# Patient Record
Sex: Female | Born: 1940 | Race: White | Hispanic: No | Marital: Married | Smoking: Never smoker
Health system: Southern US, Community
[De-identification: ages and names within clinical notes are randomized; demographics above are authoritative.]

## PROBLEM LIST (undated history)

## (undated) DIAGNOSIS — I6529 Occlusion and stenosis of unspecified carotid artery: Secondary | ICD-10-CM

## (undated) DIAGNOSIS — H269 Unspecified cataract: Secondary | ICD-10-CM

## (undated) HISTORY — PX: CATARACT EXTRACTION: SUR2

## (undated) HISTORY — DX: Occlusion and stenosis of unspecified carotid artery: I65.29

## (undated) HISTORY — DX: Unspecified cataract: H26.9

## (undated) HISTORY — PX: EYE SURGERY: SHX253

---

## 2013-09-09 ENCOUNTER — Other Ambulatory Visit: Payer: Self-pay | Admitting: Family Medicine

## 2013-09-09 DIAGNOSIS — Z1231 Encounter for screening mammogram for malignant neoplasm of breast: Secondary | ICD-10-CM

## 2019-10-19 ENCOUNTER — Ambulatory Visit (INDEPENDENT_AMBULATORY_CARE_PROVIDER_SITE_OTHER): Payer: Medicare PPO | Admitting: Family Medicine

## 2019-10-19 ENCOUNTER — Encounter: Payer: Self-pay | Admitting: Family Medicine

## 2019-10-19 ENCOUNTER — Other Ambulatory Visit: Payer: Self-pay

## 2019-10-19 VITALS — BP 112/62 | HR 68 | Temp 97.4°F | Resp 17 | Ht 63.0 in | Wt 97.6 lb

## 2019-10-19 DIAGNOSIS — K648 Other hemorrhoids: Secondary | ICD-10-CM

## 2019-10-19 DIAGNOSIS — M810 Age-related osteoporosis without current pathological fracture: Secondary | ICD-10-CM | POA: Diagnosis not present

## 2019-10-19 DIAGNOSIS — K219 Gastro-esophageal reflux disease without esophagitis: Secondary | ICD-10-CM | POA: Diagnosis not present

## 2019-10-19 DIAGNOSIS — I10 Essential (primary) hypertension: Secondary | ICD-10-CM

## 2019-10-19 DIAGNOSIS — K5790 Diverticulosis of intestine, part unspecified, without perforation or abscess without bleeding: Secondary | ICD-10-CM | POA: Insufficient documentation

## 2019-10-19 HISTORY — DX: Gastro-esophageal reflux disease without esophagitis: K21.9

## 2019-10-19 HISTORY — DX: Diverticulosis of intestine, part unspecified, without perforation or abscess without bleeding: K57.90

## 2019-10-19 HISTORY — DX: Essential (primary) hypertension: I10

## 2019-10-19 HISTORY — DX: Age-related osteoporosis without current pathological fracture: M81.0

## 2019-10-19 HISTORY — DX: Other hemorrhoids: K64.8

## 2019-10-19 MED ORDER — BISOPROLOL-HYDROCHLOROTHIAZIDE 2.5-6.25 MG PO TABS: 0.5000 | ORAL_TABLET | Freq: Every day | ORAL | 1 refills | Status: DC

## 2019-10-19 NOTE — Progress Notes (Signed)
New Patient Office Visit  Subjective:  Patient ID: Dawn Harris, female    DOB: 12/27/1940  Age: 79 y.o. MRN: [id number]  CC: HTN-needs medication refilled  HPI Dawn Harris presents for HTN-takes ziac 1/2 daily.  Pt has taken medication for over 12 years.  No heart issues.  No recent labwork-2018.  No h/o cholesterol medication.  Red Yeast Rice in the past.  Constipation-MOM-1996.  Colonoscopy-diverticulitis-pt did not complete follow up colonoscopy.  No blood in the bowels.  No pain with hemorrhoids and swell. Pt states stress worsens symptoms.    Osteoporosis-medications in the past-3-4 years-pt states Dr. Clarene Duke took her off medication-Calcium600 +Vit D + MV, Vit B-no h/o of fracture  Past Medical History:  Diagnosis Date  . Diverticulosis 10/19/2019  . Essential hypertension 10/19/2019  . GERD (gastroesophageal reflux disease) 10/19/2019  . Internal hemorrhoid 10/19/2019  No family history on file. Lives with her husband- Social History   Socioeconomic History  . Marital status: Married    Spouse name: Not on file  . Number of children: 2  . Years of education: Not on file  . Highest education level: Not on file  Occupational History  . Occupation: Retired  Tobacco Use  . Smoking status: Never Smoker  . Smokeless tobacco: Never Used  Vaping Use  . Vaping Use: Never used  Substance and Sexual Activity  . Alcohol use: Never  . Drug use: Never  . Sexual activity: Not Currently    Partners: Male  Other Topics Concern  . Not on file  Social History Narrative  . Not on file   Social Determinants of Health   Financial Resource Strain:   . Difficulty of Paying Living Expenses: Not on file  Food Insecurity:   . Worried About Programme researcher, broadcasting/film/video in the Last Year: Not on file  . Ran Out of Food in the Last Year: Not on file  Transportation Needs:   . Lack of Transportation (Medical): Not on file  . Lack of Transportation (Non-Medical): Not on file  Physical  Activity:   . Days of Exercise per Week: Not on file  . Minutes of Exercise per Session: Not on file  Stress:   . Feeling of Stress : Not on file  Social Connections:   . Frequency of Communication with Friends and Family: Not on file  . Frequency of Social Gatherings with Friends and Family: Not on file  . Attends Religious Services: Not on file  . Active Member of Clubs or Organizations: Not on file  . Attends Banker Meetings: Not on file  . Marital Status: Not on file  Intimate Partner Violence:   . Fear of Current or Ex-Partner: Not on file  . Emotionally Abused: Not on file  . Physically Abused: Not on file  . Sexually Abused: Not on file    ROS Review of Systems  Eyes:       Appt with eye exam in July 21  Endocrine: Negative.   Genitourinary: Negative.   Musculoskeletal: Negative.        Osteoporosis  Allergic/Immunologic: Negative.   Neurological: Negative for dizziness and headaches.  Hematological: Negative.     Objective:   Today's Vitals: BP 112/62   Pulse 68   Temp (!) 97.4 F (36.3 C)   Resp 17   Ht 5\' 3"  (1.6 m)   Wt 97 lb 9.6 oz (44.3 kg)   SpO2 99%   BMI 17.29 kg/m   Physical Exam Constitutional:  Appearance: Normal appearance.  HENT:     Head: Normocephalic and atraumatic.  Cardiovascular:     Rate and Rhythm: Normal rate and regular rhythm.     Pulses: Normal pulses.     Heart sounds: Normal heart sounds.  Pulmonary:     Effort: Pulmonary effort is normal.     Breath sounds: Normal breath sounds.  Musculoskeletal:        General: Normal range of motion.     Cervical back: Neck supple.  Neurological:     Mental Status: She is alert.  Psychiatric:        Mood and Affect: Mood normal.        Behavior: Behavior normal.     Assessment & Plan:   Problem List Items Addressed This Visit      Cardiovascular and Mediastinum   Essential hypertension   Relevant Medications   bisoprolol-hydrochlorothiazide (ZIAC)  2.5-6.25 MG tablet   aspirin EC 81 MG tablet     Digestive   GERD (gastroesophageal reflux disease)      Outpatient Encounter Medications as of 10/19/2019  Medication Sig  . aspirin EC 81 MG tablet Take 81 mg by mouth daily. Swallow whole.  . bisoprolol-hydrochlorothiazide (ZIAC) 2.5-6.25 MG tablet Take 0.5 tablets by mouth daily.  . calcium-vitamin D (OSCAL WITH D) 500-200 MG-UNIT tablet Take 1 tablet by mouth.  . Multiple Vitamins-Minerals (MULTIVITAMIN WITH MINERALS) tablet Take 1 tablet by mouth daily.  . vitamin B-12 (CYANOCOBALAMIN) 500 MCG tablet Take 500 mcg by mouth daily.   No facility-administered encounter medications on file as of 10/19/2019.    Follow-up: fasting labwork  Tryniti Laatsch Mat Carne, MD

## 2019-10-19 NOTE — Patient Instructions (Signed)
Osteoporosis  Osteoporosis is thinning and loss of density in your bones. Osteoporosis makes bones more brittle and fragile and more likely to break (fracture). Over time, osteoporosis can cause your bones to become so weak that they fracture after a minor fall. Bones in the hip, wrist, and spine are most likely to fracture due to osteoporosis. What are the causes? The exact cause of this condition is not known. What increases the risk? You may be at greater risk for osteoporosis if you:  Have a family history of the condition.  Have poor nutrition.  Use steroid medicines, such as prednisone.  Are female.  Are age 54 or older.  Smoke or have a history of smoking.  Are not physically active (are sedentary).  Are white (Caucasian) or of Asian descent.  Have a small body frame.  Take certain medicines, such as antiseizure medicines. What are the signs or symptoms? A fracture might be the first sign of osteoporosis, especially if the fracture results from a fall or injury that usually would not cause a bone to break. Other signs and symptoms include:  Pain in the neck or low back.  Stooped posture.  Loss of height. How is this diagnosed? This condition may be diagnosed based on:  Your medical history.  A physical exam.  A bone mineral density test, also called a DXA or DEXA test (dual-energy X-ray absorptiometry test). This test uses X-rays to measure the amount of minerals in your bones. How is this treated? The goal of treatment is to strengthen your bones and lower your risk for a fracture. Treatment may involve:  Making lifestyle changes, such as: ? Including foods with more calcium and vitamin D in your diet. ? Doing weight-bearing and muscle-strengthening exercises. ? Stopping tobacco use. ? Limiting alcohol intake.  Taking medicine to slow the process of bone loss or to increase bone density.  Taking daily supplements of calcium and vitamin D.  Taking  hormone replacement medicines, such as estrogen for women and testosterone for men.  Monitoring your levels of calcium and vitamin D. Follow these instructions at home:  Activity  Exercise as told by your health care provider. Ask your health care provider what exercises and activities are safe for you. You should do: ? Exercises that make you work against gravity (weight-bearing exercises), such as tai chi, yoga, or walking. ? Exercises to strengthen muscles, such as lifting weights. Lifestyle  Limit alcohol intake to no more than 1 drink a day for nonpregnant women and 2 drinks a day for men. One drink equals 12 oz of beer, 5 oz of wine, or 1 oz of hard liquor.  Do not use any products that contain nicotine or tobacco, such as cigarettes and e-cigarettes. If you need help quitting, ask your health care provider. Preventing falls  Use devices to help you move around (mobility aids) as needed, such as canes, walkers, scooters, or crutches.  Keep rooms well-lit and clutter-free.  Remove tripping hazards from walkways, including cords and throw rugs.  Install grab bars in bathrooms and safety rails on stairs.  Use rubber mats in the bathroom and other areas that are often wet or slippery.  Wear closed-toe shoes that fit well and support your feet. Wear shoes that have rubber soles or low heels.  Review your medicines with your health care provider. Some medicines can cause dizziness or changes in blood pressure, which can increase your risk of falling. General instructions  Include calcium and vitamin D in  your diet. Calcium is important for bone health, and vitamin D helps your body to absorb calcium. Good sources of calcium and vitamin D include: ? Certain fatty fish, such as salmon and tuna. ? Products that have calcium and vitamin D added to them (fortified products), such as fortified cereals. ? Egg yolks. ? Cheese. ? Liver.  Take over-the-counter and prescription medicines  only as told by your health care provider.  Keep all follow-up visits as told by your health care provider. This is important. Contact a health care provider if:  You have never been screened for osteoporosis and you are: ? A woman who is age 57 or older. ? A man who is age 38 or older. Get help right away if:  You fall or injure yourself. Summary  Osteoporosis is thinning and loss of density in your bones. This makes bones more brittle and fragile and more likely to break (fracture),even with minor falls.  The goal of treatment is to strengthen your bones and reduce your risk for a fracture.  Include calcium and vitamin D in your diet. Calcium is important for bone health, and vitamin D helps your body to absorb calcium.  Talk with your health care provider about screening for osteoporosis if you are a woman who is age 9 or older, or a man who is age 49 or older. This information is not intended to replace advice given to you by your health care provider. Make sure you discuss any questions you have with your health care provider. Document Revised: 01/18/2017 Document Reviewed: 11/30/2016 Elsevier Patient Education  2020 [employer]. Managing Your Hypertension Hypertension is commonly called high blood pressure. This is when the force of your blood pressing against the walls of your arteries is too strong. Arteries are blood vessels that carry blood from your heart throughout your body. Hypertension forces the heart to work harder to pump blood, and may cause the arteries to become narrow or stiff. Having untreated or uncontrolled hypertension can cause heart attack, stroke, kidney disease, and other problems. What are blood pressure readings? A blood pressure reading consists of a higher number over a lower number. Ideally, your blood pressure should be below 120/80. The first ("top") number is called the systolic pressure. It is a measure of the pressure in your arteries as your heart  beats. The second ("bottom") number is called the diastolic pressure. It is a measure of the pressure in your arteries as the heart relaxes. What does my blood pressure reading mean? Blood pressure is classified into four stages. Based on your blood pressure reading, your health care provider may use the following stages to determine what type of treatment you need, if any. Systolic pressure and diastolic pressure are measured in a unit called mm Hg. Normal  Systolic pressure: below 696.  Diastolic pressure: below 80. Elevated  Systolic pressure: 789-381.  Diastolic pressure: below 80. Hypertension stage 1  Systolic pressure: 017-510.  Diastolic pressure: 25-85. Hypertension stage 2  Systolic pressure: 277 or above.  Diastolic pressure: 90 or above. What health risks are associated with hypertension? Managing your hypertension is an important responsibility. Uncontrolled hypertension can lead to:  A heart attack.  A stroke.  A weakened blood vessel (aneurysm).  Heart failure.  Kidney damage.  Eye damage.  Metabolic syndrome.  Memory and concentration problems. What changes can I make to manage my hypertension? Hypertension can be managed by making lifestyle changes and possibly by taking medicines. Your health care provider will  help you make a plan to bring your blood pressure within a normal range. Eating and drinking   Eat a diet that is high in fiber and potassium, and low in salt (sodium), added sugar, and fat. An example eating plan is called the DASH (Dietary Approaches to Stop Hypertension) diet. To eat this way: ? Eat plenty of fresh fruits and vegetables. Try to fill half of your plate at each meal with fruits and vegetables. ? Eat whole grains, such as whole wheat pasta, brown rice, or whole grain bread. Fill about one quarter of your plate with whole grains. ? Eat low-fat diary products. ? Avoid fatty cuts of meat, processed or cured meats, and poultry  with skin. Fill about one quarter of your plate with lean proteins such as fish, chicken without skin, beans, eggs, and tofu. ? Avoid premade and processed foods. These tend to be higher in sodium, added sugar, and fat.  Reduce your daily sodium intake. Most people with hypertension should eat less than 1,500 mg of sodium a day.  Limit alcohol intake to no more than 1 drink a day for nonpregnant women and 2 drinks a day for men. One drink equals 12 oz of beer, 5 oz of wine, or 1 oz of hard liquor. Lifestyle  Work with your health care provider to maintain a healthy body weight, or to lose weight. Ask what an ideal weight is for you.  Get at least 30 minutes of exercise that causes your heart to beat faster (aerobic exercise) most days of the week. Activities may include walking, swimming, or biking.  Include exercise to strengthen your muscles (resistance exercise), such as weight lifting, as part of your weekly exercise routine. Try to do these types of exercises for 30 minutes at least 3 days a week.  Do not use any products that contain nicotine or tobacco, such as cigarettes and e-cigarettes. If you need help quitting, ask your health care provider.  Control any long-term (chronic) conditions you have, such as high cholesterol or diabetes. Monitoring  Monitor your blood pressure at home as told by your health care provider. Your personal target blood pressure may vary depending on your medical conditions, your age, and other factors.  Have your blood pressure checked regularly, as often as told by your health care provider. Working with your health care provider  Review all the medicines you take with your health care provider because there may be side effects or interactions.  Talk with your health care provider about your diet, exercise habits, and other lifestyle factors that may be contributing to hypertension.  Visit your health care provider regularly. Your health care provider  can help you create and adjust your plan for managing hypertension. Will I need medicine to control my blood pressure? Your health care provider may prescribe medicine if lifestyle changes are not enough to get your blood pressure under control, and if:  Your systolic blood pressure is 130 or higher.  Your diastolic blood pressure is 80 or higher. Take medicines only as told by your health care provider. Follow the directions carefully. Blood pressure medicines must be taken as prescribed. The medicine does not work as well when you skip doses. Skipping doses also puts you at risk for problems. Contact a health care provider if:  You think you are having a reaction to medicines you have taken.  You have repeated (recurrent) headaches.  You feel dizzy.  You have swelling in your ankles.  You have trouble  with your vision. Get help right away if:  You develop a severe headache or confusion.  You have unusual weakness or numbness, or you feel faint.  You have severe pain in your chest or abdomen.  You vomit repeatedly.  You have trouble breathing. Summary  Hypertension is when the force of blood pumping through your arteries is too strong. If this condition is not controlled, it may put you at risk for serious complications.  Your personal target blood pressure may vary depending on your medical conditions, your age, and other factors. For most people, a normal blood pressure is less than 120/80.  Hypertension is managed by lifestyle changes, medicines, or both. Lifestyle changes include weight loss, eating a healthy, low-sodium diet, exercising more, and limiting alcohol. This information is not intended to replace advice given to you by your health care provider. Make sure you discuss any questions you have with your health care provider. Document Revised: 05/30/2018 Document Reviewed: 01/04/2016 Elsevier Patient Education  [place].

## 2019-10-20 ENCOUNTER — Other Ambulatory Visit: Payer: Medicare PPO

## 2019-10-21 ENCOUNTER — Other Ambulatory Visit: Payer: Self-pay | Admitting: Family Medicine

## 2019-10-21 ENCOUNTER — Encounter: Payer: Self-pay | Admitting: Family Medicine

## 2019-10-21 DIAGNOSIS — N289 Disorder of kidney and ureter, unspecified: Secondary | ICD-10-CM

## 2019-10-21 LAB — COMPREHENSIVE METABOLIC PANEL
ALT: 11 IU/L (ref 0–32)
AST: 17 IU/L (ref 0–40)
Albumin/Globulin Ratio: 1.6 (ref 1.2–2.2)
Albumin: 4.6 g/dL (ref 3.7–4.7)
Alkaline Phosphatase: 70 IU/L (ref 48–121)
BUN/Creatinine Ratio: 19 (ref 12–28)
BUN: 19 mg/dL (ref 8–27)
Bilirubin Total: 0.5 mg/dL (ref 0.0–1.2)
CO2: 25 mmol/L (ref 20–29)
Calcium: 9.6 mg/dL (ref 8.7–10.3)
Chloride: 104 mmol/L (ref 96–106)
Creatinine, Ser: 1.01 mg/dL — ABNORMAL HIGH (ref 0.57–1.00)
GFR calc Af Amer: 61 mL/min/{1.73_m2} (ref >59)
GFR calc non Af Amer: 53 mL/min/{1.73_m2} — ABNORMAL LOW (ref >59)
Globulin, Total: 2.8 g/dL (ref 1.5–4.5)
Glucose: 89 mg/dL (ref 65–99)
Potassium: 4.3 mmol/L (ref 3.5–5.2)
Sodium: 141 mmol/L (ref 134–144)
Total Protein: 7.4 g/dL (ref 6.0–8.5)

## 2019-10-21 LAB — CBC WITH DIFFERENTIAL/PLATELET
Basophils Absolute: 0.1 10*3/uL (ref 0.0–0.2)
Basos: 1 %
EOS (ABSOLUTE): 0.1 10*3/uL (ref 0.0–0.4)
Eos: 1 %
Hematocrit: 39.5 % (ref 34.0–46.6)
Hemoglobin: 13.5 g/dL (ref 11.1–15.9)
Immature Grans (Abs): 0 10*3/uL (ref 0.0–0.1)
Immature Granulocytes: 0 %
Lymphocytes Absolute: 2.5 10*3/uL (ref 0.7–3.1)
Lymphs: 28 %
MCH: 31.2 pg (ref 26.6–33.0)
MCHC: 34.2 g/dL (ref 31.5–35.7)
MCV: 91 fL (ref 79–97)
Monocytes Absolute: 0.6 10*3/uL (ref 0.1–0.9)
Monocytes: 7 %
Neutrophils Absolute: 5.7 10*3/uL (ref 1.4–7.0)
Neutrophils: 63 %
Platelets: 272 10*3/uL (ref 150–450)
RBC: 4.33 x10E6/uL (ref 3.77–5.28)
RDW: 12.4 % (ref 11.7–15.4)
WBC: 9 10*3/uL (ref 3.4–10.8)

## 2019-10-21 LAB — LIPID PANEL W/O CHOL/HDL RATIO
Cholesterol, Total: 226 mg/dL — ABNORMAL HIGH (ref 100–199)
HDL: 60 mg/dL (ref >39)
LDL Chol Calc (NIH): 139 mg/dL — ABNORMAL HIGH (ref 0–99)
Triglycerides: 153 mg/dL — ABNORMAL HIGH (ref 0–149)
VLDL Cholesterol Cal: 27 mg/dL (ref 5–40)

## 2019-10-21 LAB — VITAMIN D 25 HYDROXY (VIT D DEFICIENCY, FRACTURES): Vit D, 25-Hydroxy: 50 ng/mL (ref 30.0–100.0)

## 2019-10-21 LAB — CARDIOVASCULAR RISK ASSESSMENT

## 2019-10-21 LAB — TSH: TSH: 3.68 u[IU]/mL (ref 0.450–4.500)

## 2019-12-01 ENCOUNTER — Ambulatory Visit (INDEPENDENT_AMBULATORY_CARE_PROVIDER_SITE_OTHER): Payer: Medicare Other

## 2019-12-01 DIAGNOSIS — Z23 Encounter for immunization: Secondary | ICD-10-CM

## 2019-12-01 NOTE — Progress Notes (Signed)
   Covid-19 Vaccination Clinic  Name:  Dawn Harris    MRN: [id number] DOB: [birth year]  12/01/2019  Dawn Harris was observed post Covid-19 immunization for 15 minutes without incident. She was provided with Vaccine Information Sheet and instruction to access the V-Safe system.   Dawn Harris was instructed to call 911 with any severe reactions post vaccine: Marland Kitchen Difficulty breathing  . Swelling of face and throat  . A fast heartbeat  . A bad rash all over body  . Dizziness and weakness

## 2020-03-31 DIAGNOSIS — D485 Neoplasm of uncertain behavior of skin: Secondary | ICD-10-CM | POA: Diagnosis not present

## 2020-03-31 DIAGNOSIS — D0439 Carcinoma in situ of skin of other parts of face: Secondary | ICD-10-CM | POA: Diagnosis not present

## 2020-03-31 DIAGNOSIS — L570 Actinic keratosis: Secondary | ICD-10-CM | POA: Diagnosis not present

## 2020-03-31 DIAGNOSIS — L821 Other seborrheic keratosis: Secondary | ICD-10-CM | POA: Diagnosis not present

## 2020-03-31 DIAGNOSIS — L578 Other skin changes due to chronic exposure to nonionizing radiation: Secondary | ICD-10-CM | POA: Diagnosis not present

## 2020-03-31 DIAGNOSIS — L728 Other follicular cysts of the skin and subcutaneous tissue: Secondary | ICD-10-CM | POA: Diagnosis not present

## 2020-04-21 ENCOUNTER — Ambulatory Visit: Payer: Medicare PPO | Admitting: Family Medicine

## 2020-07-28 ENCOUNTER — Ambulatory Visit: Payer: Medicare PPO | Admitting: Family Medicine

## 2020-09-21 ENCOUNTER — Encounter: Payer: Self-pay | Admitting: Legal Medicine

## 2020-09-21 ENCOUNTER — Ambulatory Visit: Payer: Medicare PPO | Admitting: Legal Medicine

## 2020-09-21 ENCOUNTER — Other Ambulatory Visit: Payer: Self-pay

## 2020-09-21 VITALS — BP 124/60 | HR 63 | Temp 97.7°F | Resp 15 | Ht 63.0 in | Wt 97.0 lb

## 2020-09-21 DIAGNOSIS — N1831 Chronic kidney disease, stage 3a: Secondary | ICD-10-CM | POA: Diagnosis not present

## 2020-09-21 DIAGNOSIS — I10 Essential (primary) hypertension: Secondary | ICD-10-CM | POA: Diagnosis not present

## 2020-09-21 DIAGNOSIS — R42 Dizziness and giddiness: Secondary | ICD-10-CM | POA: Insufficient documentation

## 2020-09-21 DIAGNOSIS — N179 Acute kidney failure, unspecified: Secondary | ICD-10-CM | POA: Diagnosis not present

## 2020-09-21 HISTORY — DX: Dizziness and giddiness: R42

## 2020-09-21 LAB — POCT URINALYSIS DIP (CLINITEK)
Bilirubin, UA: NEGATIVE
Blood, UA: NEGATIVE
Glucose, UA: NEGATIVE mg/dL
Ketones, POC UA: NEGATIVE mg/dL
Leukocytes, UA: NEGATIVE
Nitrite, UA: NEGATIVE
POC PROTEIN,UA: NEGATIVE
Spec Grav, UA: 1.01 (ref 1.010–1.025)
Urobilinogen, UA: 0.2 E.U./dL
pH, UA: 6.5 (ref 5.0–8.0)

## 2020-09-21 NOTE — Progress Notes (Signed)
Established Patient Office Visit  Subjective:  Patient ID: Dawn Harris, female    DOB: 07-21-1940  Age: 80 y.o. MRN: [id number]  CC:  Chief Complaint  Patient presents with   Dizziness   Emesis    HPI Dawn Harris presents for vertigo. She was sitting and no vertigo. Feels out of balance.  She has osteoporosis.  She is on calcium only.  Patient presents for follow up of hypertension.  Patient tolerating ziac well with side effects.  Patient was diagnosed with hypertension 2010 so has been treated for hypertension for 10 years.Patient is working on maintaining diet and exercise regimen and follows up as directed. Complication include none.   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  No family history on file.  Social History   Socioeconomic History   Marital status: Married    Spouse name: Not on file   Number of children: 2   Years of education: Not on file   Highest education level: Not on file  Occupational History   Occupation: Retired  Tobacco Use   Smoking status: Never   Smokeless tobacco: Never  Vaping Use   Vaping Use: Never used  Substance and Sexual Activity   Alcohol use: Never   Drug use: Never   Sexual activity: Not Currently    Partners: Male  Other Topics Concern   Not on file  Social History Narrative   Not on file   Social Determinants of Health   Financial Resource Strain: Not on file  Food Insecurity: Not on file  Transportation Needs: Not on file  Physical Activity: Not on file  Stress: Not on file  Social Connections: Not on file  Intimate Partner Violence: Not on file    Outpatient Medications Prior to Visit  Medication Sig Dispense Refill   aspirin EC 81 MG tablet Take 81 mg by mouth daily. Swallow whole.      bisoprolol-hydrochlorothiazide (ZIAC) 2.5-6.25 MG tablet Take 0.5 tablets by mouth daily. 90 tablet 1   calcium-vitamin D (OSCAL WITH D) 500-200 MG-UNIT tablet Take 1 tablet by mouth.     Multiple Vitamins-Minerals (MULTIVITAMIN WITH MINERALS) tablet Take 1 tablet by mouth daily.     vitamin B-12 (CYANOCOBALAMIN) 500 MCG tablet Take 500 mcg by mouth daily.     No facility-administered medications prior to visit.    Allergies  Allergen Reactions   Penicillins Hives    ROS Review of Systems  Constitutional:  Negative for activity change and appetite change.  HENT:  Negative for congestion.   Eyes:  Negative for visual disturbance.  Respiratory:  Negative for chest tightness and shortness of breath.   Cardiovascular:  Negative for chest pain and palpitations.  Gastrointestinal:  Negative for abdominal distention and abdominal pain.  Endocrine: Negative for polyuria.  Genitourinary:  Negative for difficulty urinating and dysuria.  Musculoskeletal:  Negative for arthralgias and back pain.  Neurological:  Positive for light-headedness.  Psychiatric/Behavioral: Negative.       Objective:    Physical Exam Vitals reviewed.  Constitutional:      General: She is in acute distress.     Appearance: Normal appearance.  HENT:     Head: Normocephalic.     Right Ear: Tympanic membrane, ear canal and external ear normal.  Left Ear: Tympanic membrane, ear canal and external ear normal.     Mouth/Throat:     Mouth: Mucous membranes are moist.     Pharynx: Oropharynx is clear.  Eyes:     Extraocular Movements: Extraocular movements intact.     Conjunctiva/sclera: Conjunctivae normal.     Pupils: Pupils are equal, round, and reactive to light.  Cardiovascular:     Rate and Rhythm: Normal rate and regular rhythm.     Pulses: Normal pulses.     Heart sounds: Normal heart sounds. No murmur heard.   No gallop.  Pulmonary:     Effort: Pulmonary effort is normal. No respiratory  distress.     Breath sounds: Normal breath sounds. No wheezing.  Abdominal:     General: Abdomen is flat. Bowel sounds are normal. There is no distension.     Palpations: Abdomen is soft.     Tenderness: There is no abdominal tenderness.  Musculoskeletal:        General: Normal range of motion.     Cervical back: Normal range of motion and neck supple.     Comments: Muscle wasting  Skin:    General: Skin is warm.     Capillary Refill: Capillary refill takes less than 2 seconds.  Neurological:     General: No focal deficit present.     Mental Status: She is alert and oriented to person, place, and time. Mental status is at baseline.     Comments: Negative Rhomberg    BP 124/60   Pulse 63   Temp 97.7 F (36.5 C)   Resp 15   Ht 5\' 3"  (1.6 m)   Wt 97 lb (44 kg)   LMP  (LMP Unknown)   SpO2 97%   BMI 17.18 kg/m  Wt Readings from Last 3 Encounters:  09/21/20 97 lb (44 kg)  10/19/19 97 lb 9.6 oz (44.3 kg)     Health Maintenance Due  Topic Date Due   TETANUS/TDAP  Never done   Zoster Vaccines- Shingrix (1 of 2) Never done   DEXA SCAN  Never done   PNA vac Low Risk Adult (1 of 2 - PCV13) Never done   COVID-19 Vaccine (2 - Pfizer series) 12/22/2019   INFLUENZA VACCINE  09/19/2020    There are no preventive care reminders to display for this patient.  Lab Results  Component Value Date   TSH 3.680 10/20/2019   Lab Results  Component Value Date   WBC 9.0 10/20/2019   HGB 13.5 10/20/2019   HCT 39.5 10/20/2019   MCV 91 10/20/2019   PLT 272 10/20/2019   Lab Results  Component Value Date   NA 141 10/20/2019   K 4.3 10/20/2019   CO2 25 10/20/2019   GLUCOSE 89 10/20/2019   BUN 19 10/20/2019   CREATININE 1.01 (H) 10/20/2019   BILITOT 0.5 10/20/2019   ALKPHOS 70 10/20/2019   AST 17 10/20/2019   ALT 11 10/20/2019   PROT 7.4 10/20/2019   ALBUMIN 4.6 10/20/2019   CALCIUM 9.6 10/20/2019   Lab Results  Component Value Date   CHOL 226 (H) 10/20/2019   Lab Results   Component Value Date   HDL 60 10/20/2019   Lab Results  Component Value Date   LDLCALC 139 (H) 10/20/2019   Lab Results  Component Value Date   TRIG 153 (H) 10/20/2019   No results found for: CHOLHDL No results found for: 10/22/2019    Assessment & Plan:   Problem  List Items Addressed This Visit   Diagnoses and all orders for this visit: Essential hypertension An individual hypertension care plan was established and reinforced today.  The patient's status was assessed using clinical findings on exam and labs or diagnostic tests. The patient's success at meeting treatment goals on disease specific evidence-based guidelines and found to be well controlled. SELF MANAGEMENT: The patient and I together assessed ways to personally work towards obtaining the recommended goals. RECOMMENDATIONS: avoid decongestants found in common cold remedies, decrease consumption of alcohol, perform routine monitoring of BP with home BP cuff, exercise, reduction of dietary salt, take medicines as prescribed, try not to miss doses and quit smoking.  Regular exercise and maintaining a healthy weight is needed.  Stress reduction may help. A CLINICAL SUMMARY including written plan identify barriers to care unique to individual due to social or financial issues.  We attempt to mutually creat solutions for individual and family understanding.   Acute renal failure superimposed on stage 3a chronic  kidney disease, unspecified acute renal failure type (HCC) She has been not eating or drinking well, UA OK, check lab , encourage fluids  Dizziness  Tests so far normal, get lab studies, may need further workup        Follow-up: Return in about 2 weeks (around 10/05/2020) for dizzines.    Brent Bulla, MD

## 2020-09-22 ENCOUNTER — Other Ambulatory Visit: Payer: Self-pay

## 2020-09-22 DIAGNOSIS — E871 Hypo-osmolality and hyponatremia: Secondary | ICD-10-CM

## 2020-09-22 LAB — COMPREHENSIVE METABOLIC PANEL
ALT: 12 IU/L (ref 0–32)
AST: 23 IU/L (ref 0–40)
Albumin/Globulin Ratio: 1.8 (ref 1.2–2.2)
Albumin: 4.4 g/dL (ref 3.7–4.7)
Alkaline Phosphatase: 86 IU/L (ref 44–121)
BUN/Creatinine Ratio: 15 (ref 12–28)
BUN: 15 mg/dL (ref 8–27)
Bilirubin Total: 0.3 mg/dL (ref 0.0–1.2)
CO2: 23 mmol/L (ref 20–29)
Calcium: 9.7 mg/dL (ref 8.7–10.3)
Chloride: 95 mmol/L — ABNORMAL LOW (ref 96–106)
Creatinine, Ser: 0.98 mg/dL (ref 0.57–1.00)
Globulin, Total: 2.5 g/dL (ref 1.5–4.5)
Glucose: 113 mg/dL — ABNORMAL HIGH (ref 65–99)
Potassium: 4 mmol/L (ref 3.5–5.2)
Sodium: 132 mmol/L — ABNORMAL LOW (ref 134–144)
Total Protein: 6.9 g/dL (ref 6.0–8.5)
eGFR: 58 mL/min/{1.73_m2} — ABNORMAL LOW (ref >59)

## 2020-09-22 LAB — CBC WITH DIFFERENTIAL/PLATELET
Basophils Absolute: 0 10*3/uL (ref 0.0–0.2)
Basos: 0 %
EOS (ABSOLUTE): 0 10*3/uL (ref 0.0–0.4)
Eos: 0 %
Hematocrit: 37.5 % (ref 34.0–46.6)
Hemoglobin: 12.9 g/dL (ref 11.1–15.9)
Immature Grans (Abs): 0 10*3/uL (ref 0.0–0.1)
Immature Granulocytes: 0 %
Lymphocytes Absolute: 1.4 10*3/uL (ref 0.7–3.1)
Lymphs: 20 %
MCH: 30.8 pg (ref 26.6–33.0)
MCHC: 34.4 g/dL (ref 31.5–35.7)
MCV: 90 fL (ref 79–97)
Monocytes Absolute: 0.4 10*3/uL (ref 0.1–0.9)
Monocytes: 5 %
Neutrophils Absolute: 5.4 10*3/uL (ref 1.4–7.0)
Neutrophils: 75 %
Platelets: 265 10*3/uL (ref 150–450)
RBC: 4.19 x10E6/uL (ref 3.77–5.28)
RDW: 12.1 % (ref 11.7–15.4)
WBC: 7.3 10*3/uL (ref 3.4–10.8)

## 2020-09-22 NOTE — Progress Notes (Signed)
CBC normal, glucose 113, kidney tests stage 3a, sodium low- may need to libralize salt for next week, liver tests ok, , recheck cmp in one week lp

## 2020-09-29 ENCOUNTER — Other Ambulatory Visit: Payer: Self-pay

## 2020-09-29 ENCOUNTER — Other Ambulatory Visit: Payer: Medicare PPO

## 2020-09-29 DIAGNOSIS — E871 Hypo-osmolality and hyponatremia: Secondary | ICD-10-CM

## 2020-09-30 LAB — COMPREHENSIVE METABOLIC PANEL
ALT: 15 IU/L (ref 0–32)
AST: 21 IU/L (ref 0–40)
Albumin/Globulin Ratio: 1.9 (ref 1.2–2.2)
Albumin: 4.5 g/dL (ref 3.7–4.7)
Alkaline Phosphatase: 76 IU/L (ref 44–121)
BUN/Creatinine Ratio: 16 (ref 12–28)
BUN: 13 mg/dL (ref 8–27)
Bilirubin Total: 0.3 mg/dL (ref 0.0–1.2)
CO2: 23 mmol/L (ref 20–29)
Calcium: 9.1 mg/dL (ref 8.7–10.3)
Chloride: 100 mmol/L (ref 96–106)
Creatinine, Ser: 0.81 mg/dL (ref 0.57–1.00)
Globulin, Total: 2.4 g/dL (ref 1.5–4.5)
Glucose: 88 mg/dL (ref 65–99)
Potassium: 4.3 mmol/L (ref 3.5–5.2)
Sodium: 137 mmol/L (ref 134–144)
Total Protein: 6.9 g/dL (ref 6.0–8.5)
eGFR: 73 mL/min/{1.73_m2} (ref >59)

## 2020-09-30 NOTE — Progress Notes (Signed)
CMP normal,  eGFR now 73 normal lp

## 2020-10-05 ENCOUNTER — Ambulatory Visit: Payer: Medicare PPO | Admitting: Legal Medicine

## 2020-10-05 ENCOUNTER — Other Ambulatory Visit: Payer: Self-pay

## 2020-10-05 ENCOUNTER — Telehealth: Payer: Self-pay | Admitting: Family Medicine

## 2020-10-05 ENCOUNTER — Encounter: Payer: Self-pay | Admitting: Legal Medicine

## 2020-10-05 VITALS — BP 110/68 | HR 71 | Temp 97.4°F | Ht 63.0 in | Wt 97.2 lb

## 2020-10-05 DIAGNOSIS — R42 Dizziness and giddiness: Secondary | ICD-10-CM

## 2020-10-05 DIAGNOSIS — R5383 Other fatigue: Secondary | ICD-10-CM

## 2020-10-05 DIAGNOSIS — E782 Mixed hyperlipidemia: Secondary | ICD-10-CM | POA: Diagnosis not present

## 2020-10-05 NOTE — Telephone Encounter (Signed)
   Dawn Harris has been scheduled for the following appointment:  WHAT: CT OF BRAIN WHERE: [facility] DATE: 10/19/2020 TIME: 10:30 AM ARRIVAL FOR 11:00 AM APPT  Patient has been made aware.

## 2020-10-05 NOTE — Progress Notes (Signed)
Established Patient Office Visit  Subjective:  Patient ID: Dawn Harris, female    DOB: 23-Mar-1940  Age: 80 y.o. MRN: [id number]  CC:  Chief Complaint  Patient presents with   Dizziness    2 week follow up on dizziness.     HPI [name] presents for follow up on dizziness, patient had low sodium and some dehydration.  She is rehydrated.  Patient is worried a lot. She has no further  dizziness or falls. No chest pains.    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 No pertinent surgical history.  History reviewed. No pertinent family history.  Social History   Socioeconomic History   Marital status: Married    Spouse name: Not on file   Number of children: 2   Years of education: Not on file   Highest education level: Not on file  Occupational History   Occupation: Retired  Tobacco Use   Smoking status: Never   Smokeless tobacco: Never  Vaping Use   Vaping Use: Never used  Substance and Sexual Activity   Alcohol use: Never   Drug use: Never   Sexual activity: Not Currently    Partners: Male  Other Topics Concern   Not on file  Social History Narrative   Not on file   Social Determinants of Health   Financial Resource Strain: Not on file  Food Insecurity: Not on file  Transportation Needs: Not on file  Physical Activity: Not on file  Stress: Not on file  Social Connections: Not on file  Intimate Partner Violence: Not on file    Outpatient Medications Prior to Visit  Medication Sig Dispense Refill   aspirin EC 81 MG tablet Take 81 mg by mouth daily. Swallow whole.     bisoprolol-hydrochlorothiazide (ZIAC) 2.5-6.25 MG tablet Take 0.5 tablets by mouth daily. 90 tablet 1   calcium-vitamin D (OSCAL WITH D) 500-200 MG-UNIT tablet Take 1 tablet by mouth.      Multiple Vitamins-Minerals (MULTIVITAMIN WITH MINERALS) tablet Take 1 tablet by mouth daily.     No facility-administered medications prior to visit.    Allergies  Allergen Reactions   Penicillins Hives    ROS Review of Systems  Constitutional:  Negative for activity change and appetite change.  HENT:  Negative for congestion.   Eyes:  Negative for visual disturbance.  Respiratory:  Negative for chest tightness and shortness of breath.   Cardiovascular:  Negative for chest pain and palpitations.  Gastrointestinal:  Negative for abdominal distention and abdominal pain.  Genitourinary:  Negative for difficulty urinating and dysuria.  Neurological:  Positive for dizziness.  Psychiatric/Behavioral: Negative.       Objective:    Physical Exam Vitals reviewed.  Constitutional:      Appearance: Normal appearance.  HENT:     Right Ear: Tympanic membrane, ear canal and external ear normal.     Left Ear: Tympanic membrane, ear canal and external ear normal.     Mouth/Throat:     Mouth: Mucous membranes are moist.     Pharynx: Oropharynx is clear.  Eyes:     Extraocular Movements: Extraocular movements intact.     Conjunctiva/sclera: Conjunctivae normal.     Pupils: Pupils are equal, round, and reactive to light.  Cardiovascular:     Rate and Rhythm: Normal rate  and regular rhythm.     Pulses: Normal pulses.     Heart sounds: Normal heart sounds. No murmur heard.   No gallop.  Pulmonary:     Effort: Pulmonary effort is normal. No respiratory distress.     Breath sounds: Normal breath sounds. No wheezing.  Abdominal:     General: Abdomen is flat. Bowel sounds are normal. There is no distension.     Palpations: Abdomen is soft.     Tenderness: There is no abdominal tenderness.  Musculoskeletal:     Cervical back: Normal range of motion.     Right lower leg: No edema.     Left lower leg: No edema.     Comments: Muscle wasting  Skin:    Capillary Refill: Capillary refill  takes less than 2 seconds.     Findings: Bruising present.  Neurological:     General: No focal deficit present.     Mental Status: She is alert and oriented to person, place, and time.     Comments: Negative Rhomberg  EKG: NSR normal  BP 110/68   Pulse 71   Temp (!) 97.4 F (36.3 C)   Ht 5' 3"  (1.6 m)   Wt 97 lb 3.2 oz (44.1 kg)   LMP  (LMP Unknown)   SpO2 98%   BMI 17.22 kg/m  Wt Readings from Last 3 Encounters:  10/05/20 97 lb 3.2 oz (44.1 kg)  09/21/20 97 lb (44 kg)  10/19/19 97 lb 9.6 oz (44.3 kg)     Health Maintenance Due  Topic Date Due   TETANUS/TDAP  Never done   Zoster Vaccines- Shingrix (1 of 2) Never done   DEXA SCAN  Never done   PNA vac Low Risk Adult (1 of 2 - PCV13) Never done   COVID-19 Vaccine (4 - Booster for Pfizer series) 04/02/2020   INFLUENZA VACCINE  09/19/2020    There are no preventive care reminders to display for this patient.  Lab Results  Component Value Date   TSH 3.680 10/20/2019   Lab Results  Component Value Date   WBC 7.3 09/21/2020   HGB 12.9 09/21/2020   HCT 37.5 09/21/2020   MCV 90 09/21/2020   PLT 265 09/21/2020   Lab Results  Component Value Date   NA 137 09/29/2020   K 4.3 09/29/2020   CO2 23 09/29/2020   GLUCOSE 88 09/29/2020   BUN 13 09/29/2020   CREATININE 0.81 09/29/2020   BILITOT 0.3 09/29/2020   ALKPHOS 76 09/29/2020   AST 21 09/29/2020   ALT 15 09/29/2020   PROT 6.9 09/29/2020   ALBUMIN 4.5 09/29/2020   CALCIUM 9.1 09/29/2020   EGFR 73 09/29/2020   Lab Results  Component Value Date   CHOL 226 (H) 10/20/2019   Lab Results  Component Value Date   HDL 60 10/20/2019   Lab Results  Component Value Date   LDLCALC 139 (H) 10/20/2019   Lab Results  Component Value Date   TRIG 153 (H) 10/20/2019   No results found for: CHOLHDL No results found for: HGBA1C    Assessment & Plan:   Problem List Items Addressed This Visit       Other   Dizziness - Primary   Relevant Orders   EKG 12-Lead    CT HEAD WO CONTRAST (5MM) Vertigo resolving with hydration, GET CT scan head   Other Visit Diagnoses     Fatigue, unspecified type       Relevant Orders  Vitamin B12   TSH   CT HEAD WO CONTRAST (5MM) She says she is weak, check lab    Mixed hyperlipidemia       Relevant Orders   Lipid panel AN INDIVIDUAL CARE PLAN for hyperlipidemia/ cholesterol was established and reinforced today.  The patient's status was assessed using clinical findings on exam, lab and other diagnostic tests. The patient's disease status was assessed based on evidence-based guidelines and found to be fair controlled. MEDICATIONS were reviewed. SELF MANAGEMENT GOALS have been discussed and patient's success at attaining the goal of low cholesterol was assessed. RECOMMENDATION given include regular exercise 3 days a week and low cholesterol/low fat diet. CLINICAL SUMMARY including written plan to identify barriers unique to the patient due to social or economic  reasons was discussed.          Follow-up: Return follow up Dr. Tobie Poet.    Reinaldo Meeker, MD

## 2020-10-06 LAB — CARDIOVASCULAR RISK ASSESSMENT

## 2020-10-06 LAB — LIPID PANEL
Chol/HDL Ratio: 4 ratio (ref 0.0–4.4)
Cholesterol, Total: 248 mg/dL — ABNORMAL HIGH (ref 100–199)
HDL: 62 mg/dL (ref >39)
LDL Chol Calc (NIH): 154 mg/dL — ABNORMAL HIGH (ref 0–99)
Triglycerides: 176 mg/dL — ABNORMAL HIGH (ref 0–149)
VLDL Cholesterol Cal: 32 mg/dL (ref 5–40)

## 2020-10-06 LAB — TSH: TSH: 3.85 u[IU]/mL (ref 0.450–4.500)

## 2020-10-06 LAB — VITAMIN B12: Vitamin B-12: 580 pg/mL (ref 232–1245)

## 2020-10-06 NOTE — Progress Notes (Signed)
B12 level 580 normal, triglycerides high 176, cholesterol 154 high, TSH 3.85 normal level lp

## 2020-11-01 ENCOUNTER — Ambulatory Visit: Payer: Medicare PPO

## 2021-01-18 ENCOUNTER — Other Ambulatory Visit: Payer: Self-pay

## 2021-01-18 DIAGNOSIS — I10 Essential (primary) hypertension: Secondary | ICD-10-CM

## 2021-01-18 MED ORDER — BISOPROLOL-HYDROCHLOROTHIAZIDE 2.5-6.25 MG PO TABS: 0.5000 | ORAL_TABLET | Freq: Every day | ORAL | 1 refills | Status: DC

## 2021-03-15 ENCOUNTER — Encounter: Payer: Self-pay | Admitting: Sports Medicine

## 2021-03-15 ENCOUNTER — Other Ambulatory Visit: Payer: Self-pay

## 2021-03-15 ENCOUNTER — Ambulatory Visit (INDEPENDENT_AMBULATORY_CARE_PROVIDER_SITE_OTHER): Payer: Medicare HMO | Admitting: Sports Medicine

## 2021-03-15 DIAGNOSIS — I739 Peripheral vascular disease, unspecified: Secondary | ICD-10-CM

## 2021-03-15 DIAGNOSIS — B351 Tinea unguium: Secondary | ICD-10-CM | POA: Diagnosis not present

## 2021-03-15 DIAGNOSIS — M79609 Pain in unspecified limb: Secondary | ICD-10-CM | POA: Diagnosis not present

## 2021-03-15 NOTE — Progress Notes (Signed)
Subjective: Dawn Harris is a 81 y.o. female patient seen today in office with complaint of mildly painful thickened and elongated toenails; unable to trim especially right 2nd toenail. Patient denies history of Diabetes, Neuropathy, or Vascular disease except taking a baby aspirin and has issues with blood pressure. Patient has no other pedal complaints at this time.   Patient is assisted by daughter this visit.   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 Swallow whole.     bisoprolol-hydrochlorothiazide (ZIAC) 2.5-6.25 MG tablet Take 0.5 tablets by mouth daily. 90 tablet 1   calcium-vitamin D (OSCAL WITH D) 500-200 MG-UNIT tablet Take 1 tablet by mouth.     Multiple Vitamins-Minerals (MULTIVITAMIN WITH MINERALS) tablet Take 1 tablet by mouth daily.     No current facility-administered medications on file prior to visit.    Allergies  Allergen Reactions   Penicillins Hives    Objective: Physical Exam  General: Well developed, nourished, no acute distress, awake, alert and oriented x 3  Vascular: Dorsalis pedis artery 1/4 bilateral, Posterior tibial artery 1/4 bilateral, skin temperature warm to warm proximal to distal bilateral lower extremities, ++ varicosities, pedal hair present bilateral.  Neurological: Gross sensation present via light touch bilateral.   Dermatological: Skin is warm, dry, and supple bilateral, Nails 1-10 are tender, long, thick, and discolored with mild subungal debris with right 2nd toenail most involved, no webspace macerations present bilateral, no open lesions present bilateral, no  callus/corns/hyperkeratotic tissue present bilateral. No signs of infection bilateral.  Musculoskeletal: Asymptomatic hammertoe boney deformities noted bilateral. Muscular strength within normal limits without painon range of motion. No pain with calf compression bilateral.  Assessment and Plan:  Problem List Items Addressed This Visit   None Visit Diagnoses     Pain due to onychomycosis of nail    -  Primary   PVD (peripheral vascular disease) (HCC)           -Examined patient.  -Discussed treatment options for painful mycotic nails. -Mechanically debrided and reduced mycotic nails with sterile nail nipper and dremel nail file without incident. -Patient to return in 3 months for follow up evaluation or sooner if symptoms worsen.  Asencion Islam, DPM

## 2021-06-14 ENCOUNTER — Encounter: Payer: Self-pay | Admitting: Sports Medicine

## 2021-06-14 ENCOUNTER — Ambulatory Visit (INDEPENDENT_AMBULATORY_CARE_PROVIDER_SITE_OTHER): Payer: Medicare HMO | Admitting: Sports Medicine

## 2021-06-14 DIAGNOSIS — B351 Tinea unguium: Secondary | ICD-10-CM

## 2021-06-14 DIAGNOSIS — I739 Peripheral vascular disease, unspecified: Secondary | ICD-10-CM

## 2021-06-14 DIAGNOSIS — M79609 Pain in unspecified limb: Secondary | ICD-10-CM | POA: Diagnosis not present

## 2021-06-14 NOTE — Progress Notes (Signed)
Subjective: ?Dawn Harris is a 81 y.o. female patient seen today in office with complaint of mildly painful thickened and elongated toenails; unable to trim.  Patient denies any changes with medication or health history since last encounter. ? ? ?Patient Active Problem List  ? Diagnosis Date Noted  ? Dizziness 09/21/2020  ? Essential hypertension 10/19/2019  ? Diverticulosis 10/19/2019  ? GERD (gastroesophageal reflux disease) 10/19/2019  ? Internal hemorrhoid 10/19/2019  ? 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 ? ?Current Outpatient Medications on File Prior to Visit  ?Medication Sig Dispense Refill  ? aspirin EC 81 MG tablet Take 81 mg by mouth daily. Swallow whole.    ? bisoprolol-hydrochlorothiazide (ZIAC) 2.5-6.25 MG tablet Take 0.5 tablets by mouth daily. 90 tablet 1  ? calcium-vitamin D (OSCAL WITH D) 500-200 MG-UNIT tablet Take 1 tablet by mouth.    ? Multiple Vitamins-Minerals (MULTIVITAMIN WITH MINERALS) tablet Take 1 tablet by mouth daily.    ? ?No current facility-administered medications on file prior to visit.  ? ? ?Allergies  ?Allergen Reactions  ? Penicillins Hives  ? ? ?Objective: ?Physical Exam ? ?General: Well developed, nourished, no acute distress, awake, alert and oriented x 3 ? ?Vascular: Dorsalis pedis artery 1/4 bilateral, Posterior tibial artery 1/4 bilateral, skin temperature warm to warm proximal to distal bilateral lower extremities, ++ varicosities, pedal hair present bilateral. ? ?Neurological: Gross sensation present via light touch bilateral.  ? ?Dermatological: Skin is warm, dry, and supple bilateral, Nails 1-10 are tender, long, thick, and discolored with mild subungal debris, no webspace macerations present bilateral, no open lesions present bilateral, no callus/corns/hyperkeratotic tissue present bilateral. No signs of infection bilateral. ? ?Musculoskeletal: Asymptomatic hammertoe boney deformities noted bilateral. Muscular strength within normal limits without painon range of  motion. No pain with calf compression bilateral. ? ?Assessment and Plan:  ?Problem List Items Addressed This Visit   ?None ?Visit Diagnoses   ? ? Pain due to onychomycosis of nail    -  Primary  ? PVD (peripheral vascular disease) (HCC)      ? ?  ?-Examined patient.  ?-Discussed treatment options for painful mycotic nails. ?-Mechanically debrided and reduced all painful mycotic nails x10 with sterile nail nipper and dremel nail file without incident. ?-Patient to return in 3 months for follow up evaluation or sooner if symptoms worsen. ? ?Asencion Islam, DPM ? ?

## 2021-06-23 ENCOUNTER — Ambulatory Visit: Payer: Medicare HMO | Admitting: Family Medicine

## 2021-06-23 ENCOUNTER — Encounter: Payer: Self-pay | Admitting: Family Medicine

## 2021-06-23 VITALS — BP 124/74 | HR 63 | Ht 64.0 in | Wt 96.0 lb

## 2021-06-23 DIAGNOSIS — Z0000 Encounter for general adult medical examination without abnormal findings: Secondary | ICD-10-CM | POA: Diagnosis not present

## 2021-06-23 NOTE — Progress Notes (Addendum)
? ?Annual Wellness Visit ? ? I connected with  Bunnie Philips on 06/23/21 by a audio enabled telemedicine application and verified that I am speaking with the correct person using two identifiers. ? ?Patient Location: Home ? ?Provider Location: Home Office ? ?I discussed the limitations of evaluation and management by telemedicine. The patient expressed understanding and agreed to proceed. ? ? ? ?Patient: Dawn Harris, Female    DOB: 29-Apr-1940, 81 y.o.   MRN: [id number] ? ?Subjective  ?No chief complaint on file. ? ? ?Scottie Stanish is a 81 y.o. female who presents today for her Annual Wellness Visit. ?She reports consuming a general diet.  Walks around  She generally feels well. She reports sleeping well. She does not have additional problems to discuss today.  ? ? ?Vision:2021 will have recheck this year and Dental: No current dental problems and Last dental visit: Jan 2023, goes back in Aug ? ? ?Patient Active Problem List  ? Diagnosis Date Noted  ? Dizziness 09/21/2020  ? Essential hypertension 10/19/2019  ? Diverticulosis 10/19/2019  ? GERD (gastroesophageal reflux disease) 10/19/2019  ? Internal hemorrhoid 10/19/2019  ? 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 ?Past Medical History:  ?Diagnosis Date  ? 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 Diverticulosis 10/19/2019  ? Essential hypertension 10/19/2019  ? GERD (gastroesophageal reflux disease) 10/19/2019  ? Internal hemorrhoid 10/19/2019  ? ?Allergies  ?Allergen Reactions  ? Penicillins Hives  ? ?  ? ?Medications: ?Outpatient Medications Prior to Visit  ?Medication Sig  ? aspirin EC 81 MG tablet Take 81 mg by mouth daily. Swallow whole.  ? bisoprolol-hydrochlorothiazide (ZIAC) 2.5-6.25 MG tablet Take 0.5 tablets by mouth daily.  ? calcium-vitamin D (OSCAL WITH D) 500-200 MG-UNIT tablet Take 1 tablet by mouth.  ? Multiple Vitamins-Minerals (MULTIVITAMIN WITH MINERALS) tablet Take 1 tablet by mouth daily.  ? ?No facility-administered medications prior to visit.  ?   ?Allergies  ?Allergen Reactions  ? Penicillins Hives  ? ? ?Patient Care Team: ?CoxElnita Maxwell, MD as PCP - General (Family Medicine) ? ? ? ?  ? ?Objective  ?BP 124/74   Pulse 63   Ht 5' 4"  (1.626 m)   Wt 96 lb (43.5 kg)   LMP  (LMP Unknown)   BMI 16.48 kg/m?  ?BP Readings from Last 3 Encounters:  ?06/23/21 124/74  ?10/05/20 110/68  ?09/21/20 124/60  ? ?  ? ? ? ? ?Most recent functional status assessment: ? ?  06/23/2021  ?  8:57 AM  ?In your present state of health, do you have any difficulty performing the following activities:  ?Hearing? 0  ?Vision? 1  ?Difficulty concentrating or making decisions? 0  ?Walking or climbing stairs? 0  ?Dressing or bathing? 0  ?Doing errands, shopping? 0  ?Comment daughter takes out of city if needed to go somewhere  ?Preparing Food and eating ? N  ?Using the Toilet? N  ?In the past six months, have you accidently leaked urine? N  ?Do you have problems with loss of bowel control? N  ?Managing your Medications? N  ?Managing your Finances? N  ?Housekeeping or managing your Housekeeping? N  ? ?Most recent fall risk assessment: ? ?  06/23/2021  ?  8:56 AM  ?Fall Risk   ?Falls in the past year? 0  ?Number falls in past yr: 0  ?Injury with Fall? 0  ?Risk for fall due to : No Fall Risks  ? ? Most recent depression screenings: ? ?  06/23/2021  ?  8:56 AM  10/05/2020  ?  9:20 AM  ?PHQ 2/9 Scores  ?PHQ - 2 Score 0 0  ? ?Most recent cognitive screening: ? ?  06/23/2021  ?  8:58 AM  ?6CIT Screen  ?What Year? 0 points  ?What month? 0 points  ?What time? 0 points  ?Count back from 20 0 points  ?Months in reverse 0 points  ?Repeat phrase 0 points  ?Total Score 0 points  ? ?Most recent Audit-C alcohol use screening ? ?  06/23/2021  ?  8:55 AM  ?Alcohol Use Disorder Test (AUDIT)  ?1. How often do you have a drink containing alcohol? 0  ?2. How many drinks containing alcohol do you have on a typical day when you are drinking? 0  ?3. How often do you have six or more drinks on one occasion? 0  ?AUDIT-C Score 0   ? ?A score of 3 or more in women, and 4 or more in men indicates increased risk for alcohol abuse, EXCEPT if all of the points are from question 1  ? ?Vision/Hearing Screen: ?Vision Screening - Comments:: Checked 2021 due this year  ? ?Last CBC ?Lab Results  ?Component Value Date  ? WBC 7.3 09/21/2020  ? HGB 12.9 09/21/2020  ? HCT 37.5 09/21/2020  ? MCV 90 09/21/2020  ? MCH 30.8 09/21/2020  ? RDW 12.1 09/21/2020  ? PLT 265 09/21/2020  ? ?Last metabolic panel ?Lab Results  ?Component Value Date  ? GLUCOSE 88 09/29/2020  ? NA 137 09/29/2020  ? K 4.3 09/29/2020  ? CL 100 09/29/2020  ? CO2 23 09/29/2020  ? BUN 13 09/29/2020  ? CREATININE 0.81 09/29/2020  ? EGFR 73 09/29/2020  ? CALCIUM 9.1 09/29/2020  ? PROT 6.9 09/29/2020  ? ALBUMIN 4.5 09/29/2020  ? LABGLOB 2.4 09/29/2020  ? AGRATIO 1.9 09/29/2020  ? BILITOT 0.3 09/29/2020  ? ALKPHOS 76 09/29/2020  ? AST 21 09/29/2020  ? ALT 15 09/29/2020  ? ?Last lipids ?Lab Results  ?Component Value Date  ? CHOL 248 (H) 10/05/2020  ? HDL 62 10/05/2020  ? LDLCALC 154 (H) 10/05/2020  ? TRIG 176 (H) 10/05/2020  ? CHOLHDL 4.0 10/05/2020  ? ?Last thyroid functions ?Lab Results  ?Component Value Date  ? TSH 3.850 10/05/2020  ? ?Last vitamin D ?Lab Results  ?Component Value Date  ? VD25OH 50.0 10/20/2019  ? ?  ? ?No results found for any visits on 06/23/21. ?  ? ?Assessment & Plan  ? ?Annual wellness visit done today including the all of the following: ?Reviewed patient's Family Medical History ?Reviewed and updated list of patient's medical providers ?Assessment of cognitive impairment was done ?Assessed patient's functional ability ?Established a written schedule for health screening services ?Health Risk Assessent Completed and Reviewed ? ?Exercise Activities and Dietary recommendations ? Goals   ?None ?  ? ? ?Immunization History  ?Administered Date(s) Administered  ? PFIZER(Purple Top)SARS-COV-2 Vaccination 04/13/2019, 05/04/2019, 12/01/2019  ? ? ?Health Maintenance  ?Topic Date Due   ? Pneumonia Vaccine 103+ Years old (1 - PCV) Never done  ? TETANUS/TDAP  Never done  ? Zoster Vaccines- Shingrix (1 of 2) Never done  ? DEXA SCAN  Never done  ? COVID-19 Vaccine (4 - Booster for Pfizer series) 01/26/2020  ? INFLUENZA VACCINE  09/19/2021  ? HPV VACCINES  Aged Out  ? ? ? ?Discussed health benefits of physical activity, and encouraged her to engage in regular exercise appropriate for her age and condition.  ?  ?Problem List Items  Addressed This Visit   ?None ?Visit Diagnoses   ? ? Encounter for annual wellness visit (AWV) in Medicare patient    -  Primary  ? ?  ? ? ?No follow-ups on file.  ?  ? ? ?Perlie Mayo, NP ? ? ? ?

## 2021-06-23 NOTE — Patient Instructions (Signed)
Dawn Harris , ?Thank you for taking time to come for your Medicare Wellness Visit. I appreciate your ongoing commitment to your health goals. Please review the following plan we discussed and let me know if I can assist you in the future.  ? ?Screening recommendations/referrals: ?Colonoscopy: No longer need ?Mammogram: Due- but declined.  ?Bone Density: Reports years ago- was on meds for it, has not had recheck- call office if you decided to. ?Recommended yearly ophthalmology/optometry visit for glaucoma screening and checkup ?Recommended yearly dental visit for hygiene and checkup ? ?Vaccinations: ?Influenza vaccine: up to date ?Pneumococcal vaccine: Declined ?Tdap vaccine: Allergy when younger ?Shingles vaccine: Declined ? ?Advanced directives: Declined ? ?Next appointment: Needs to set up ? ? ? ?Preventive Care 81 Years and Older, Female ?Preventive care refers to lifestyle choices and visits with your health care provider that can promote health and wellness. ?What does preventive care include? ?A yearly physical exam. This is also called an annual well check. ?Dental exams once or twice a year. ?Routine eye exams. Ask your health care provider how often you should have your eyes checked. ?Personal lifestyle choices, including: ?Daily care of your teeth and gums. ?Regular physical activity. ?Eating a healthy diet. ?Avoiding tobacco and drug use. ?Limiting alcohol use. ?Practicing safe sex. ?Taking low-dose aspirin every day. ?Taking vitamin and mineral supplements as recommended by your health care provider. ?What happens during an annual well check? ?The services and screenings done by your health care provider during your annual well check will depend on your age, overall health, lifestyle risk factors, and family history of disease. ?Counseling  ?Your health care provider may ask you questions about your: ?Alcohol use. ?Tobacco use. ?Drug use. ?Emotional well-being. ?Home and relationship well-being. ?Sexual  activity. ?Eating habits. ?History of falls. ?Memory and ability to understand (cognition). ?Work and work Astronomer. ?Reproductive health. ?Screening  ?You may have the following tests or measurements: ?Height, weight, and BMI. ?Blood pressure. ?Lipid and cholesterol levels. These may be checked every 5 years, or more frequently if you are over 81 years old. ?Skin check. ?Lung cancer screening. You may have this screening every year starting at age 47 if you have a 30-pack-year history of smoking and currently smoke or have quit within the past 15 years. ?Fecal occult blood test (FOBT) of the stool. You may have this test every year starting at age 61. ?Flexible sigmoidoscopy or colonoscopy. You may have a sigmoidoscopy every 5 years or a colonoscopy every 10 years starting at age 81. ?Hepatitis C blood test. ?Hepatitis B blood test. ?Sexually transmitted disease (STD) testing. ?Diabetes screening. This is done by checking your blood sugar (glucose) after you have not eaten for a while (fasting). You may have this done every 1-3 years. ?Bone density scan. This is done to screen for osteoporosis. You may have this done starting at age 48. ?Mammogram. This may be done every 1-2 years. Talk to your health care provider about how often you should have regular mammograms. ?Talk with your health care provider about your test results, treatment options, and if necessary, the need for more tests. ?Vaccines  ?Your health care provider may recommend certain vaccines, such as: ?Influenza vaccine. This is recommended every year. ?Tetanus, diphtheria, and acellular pertussis (Tdap, Td) vaccine. You may need a Td booster every 10 years. ?Zoster vaccine. You may need this after age 33. ?Pneumococcal 13-valent conjugate (PCV13) vaccine. One dose is recommended after age 26. ?Pneumococcal polysaccharide (PPSV23) vaccine. One dose is recommended after age  65. ?Talk to your health care provider about which screenings and vaccines  you need and how often you need them. ?This information is not intended to replace advice given to you by your health care provider. Make sure you discuss any questions you have with your health care provider. ?Document Released: 03/04/2015 Document Revised: 10/26/2015 Document Reviewed: 12/07/2014 ?Elsevier Interactive Patient Education ? 2017 [employer] ? ?Fall Prevention in the Home ?Falls can cause injuries. They can happen to people of all ages. There are many things you can do to make your home safe and to help prevent falls. ?What can I do on the outside of my home? ?Regularly fix the edges of walkways and driveways and fix any cracks. ?Remove anything that might make you trip as you walk through a door, such as a raised step or threshold. ?Trim any bushes or trees on the path to your home. ?Use bright outdoor lighting. ?Clear any walking paths of anything that might make someone trip, such as rocks or tools. ?Regularly check to see if handrails are loose or broken. Make sure that both sides of any steps have handrails. ?Any raised decks and porches should have guardrails on the edges. ?Have any leaves, snow, or ice cleared regularly. ?Use sand or salt on walking paths during winter. ?Clean up any spills in your garage right away. This includes oil or grease spills. ?What can I do in the bathroom? ?Use night lights. ?Install grab bars by the toilet and in the tub and shower. Do not use towel bars as grab bars. ?Use non-skid mats or decals in the tub or shower. ?If you need to sit down in the shower, use a plastic, non-slip stool. ?Keep the floor dry. Clean up any water that spills on the floor as soon as it happens. ?Remove soap buildup in the tub or shower regularly. ?Attach bath mats securely with double-sided non-slip rug tape. ?Do not have throw rugs and other things on the floor that can make you trip. ?What can I do in the bedroom? ?Use night lights. ?Make sure that you have a light by your bed that  is easy to reach. ?Do not use any sheets or blankets that are too big for your bed. They should not hang down onto the floor. ?Have a firm chair that has side arms. You can use this for support while you get dressed. ?Do not have throw rugs and other things on the floor that can make you trip. ?What can I do in the kitchen? ?Clean up any spills right away. ?Avoid walking on wet floors. ?Keep items that you use a lot in easy-to-reach places. ?If you need to reach something above you, use a strong step stool that has a grab bar. ?Keep electrical cords out of the way. ?Do not use floor polish or wax that makes floors slippery. If you must use wax, use non-skid floor wax. ?Do not have throw rugs and other things on the floor that can make you trip. ?What can I do with my stairs? ?Do not leave any items on the stairs. ?Make sure that there are handrails on both sides of the stairs and use them. Fix handrails that are broken or loose. Make sure that handrails are as long as the stairways. ?Check any carpeting to make sure that it is firmly attached to the stairs. Fix any carpet that is loose or worn. ?Avoid having throw rugs at the top or bottom of the stairs. If you do have  throw rugs, attach them to the floor with carpet tape. ?Make sure that you have a light switch at the top of the stairs and the bottom of the stairs. If you do not have them, ask someone to add them for you. ?What else can I do to help prevent falls? ?Wear shoes that: ?Do not have high heels. ?Have rubber bottoms. ?Are comfortable and fit you well. ?Are closed at the toe. Do not wear sandals. ?If you use a stepladder: ?Make sure that it is fully opened. Do not climb a closed stepladder. ?Make sure that both sides of the stepladder are locked into place. ?Ask someone to hold it for you, if possible. ?Clearly mark and make sure that you can see: ?Any grab bars or handrails. ?First and last steps. ?Where the edge of each step is. ?Use tools that help you  move around (mobility aids) if they are needed. These include: ?Canes. ?Walkers. ?Scooters. ?Crutches. ?Turn on the lights when you go into a dark area. Replace any light bulbs as soon as they burn o

## 2021-07-14 ENCOUNTER — Encounter: Payer: Self-pay | Admitting: Physician Assistant

## 2021-07-14 ENCOUNTER — Ambulatory Visit (INDEPENDENT_AMBULATORY_CARE_PROVIDER_SITE_OTHER): Payer: Medicare HMO | Admitting: Physician Assistant

## 2021-07-14 VITALS — BP 110/70 | HR 70 | Temp 97.6°F | Resp 15 | Ht 63.0 in | Wt 95.0 lb

## 2021-07-14 DIAGNOSIS — N3001 Acute cystitis with hematuria: Secondary | ICD-10-CM | POA: Diagnosis not present

## 2021-07-14 DIAGNOSIS — R1030 Lower abdominal pain, unspecified: Secondary | ICD-10-CM | POA: Diagnosis not present

## 2021-07-14 LAB — POCT URINALYSIS DIP (CLINITEK)
Glucose, UA: NEGATIVE mg/dL
Ketones, POC UA: NEGATIVE mg/dL
Nitrite, UA: NEGATIVE
POC PROTEIN,UA: 30 — AB
Spec Grav, UA: 1.015 (ref 1.010–1.025)
Urobilinogen, UA: 1 E.U./dL
pH, UA: 6.5 (ref 5.0–8.0)

## 2021-07-14 MED ORDER — CIPROFLOXACIN HCL 500 MG PO TABS: 500.0000 mg | ORAL_TABLET | Freq: Two times a day (BID) | ORAL | 0 refills | Status: AC

## 2021-07-15 LAB — CBC WITH DIFFERENTIAL/PLATELET
Basophils Absolute: 0.1 10*3/uL (ref 0.0–0.2)
Basos: 0 %
EOS (ABSOLUTE): 0 10*3/uL (ref 0.0–0.4)
Eos: 0 %
Hematocrit: 40.9 % (ref 34.0–46.6)
Hemoglobin: 13.8 g/dL (ref 11.1–15.9)
Immature Grans (Abs): 0.1 10*3/uL (ref 0.0–0.1)
Immature Granulocytes: 1 %
Lymphocytes Absolute: 0.8 10*3/uL (ref 0.7–3.1)
Lymphs: 6 %
MCH: 29.9 pg (ref 26.6–33.0)
MCHC: 33.7 g/dL (ref 31.5–35.7)
MCV: 89 fL (ref 79–97)
Monocytes Absolute: 0.6 10*3/uL (ref 0.1–0.9)
Monocytes: 4 %
Neutrophils Absolute: 12 10*3/uL — ABNORMAL HIGH (ref 1.4–7.0)
Neutrophils: 89 %
Platelets: 296 10*3/uL (ref 150–450)
RBC: 4.62 x10E6/uL (ref 3.77–5.28)
RDW: 12.1 % (ref 11.7–15.4)
WBC: 13.5 10*3/uL — ABNORMAL HIGH (ref 3.4–10.8)

## 2021-07-15 LAB — COMPREHENSIVE METABOLIC PANEL
ALT: 18 IU/L (ref 0–32)
AST: 23 IU/L (ref 0–40)
Albumin/Globulin Ratio: 1.7 (ref 1.2–2.2)
Albumin: 4.6 g/dL (ref 3.6–4.6)
Alkaline Phosphatase: 74 IU/L (ref 44–121)
BUN/Creatinine Ratio: 14 (ref 12–28)
BUN: 15 mg/dL (ref 8–27)
Bilirubin Total: 0.5 mg/dL (ref 0.0–1.2)
CO2: 21 mmol/L (ref 20–29)
Calcium: 10.2 mg/dL (ref 8.7–10.3)
Chloride: 95 mmol/L — ABNORMAL LOW (ref 96–106)
Creatinine, Ser: 1.05 mg/dL — ABNORMAL HIGH (ref 0.57–1.00)
Globulin, Total: 2.7 g/dL (ref 1.5–4.5)
Glucose: 140 mg/dL — ABNORMAL HIGH (ref 70–99)
Potassium: 4.9 mmol/L (ref 3.5–5.2)
Sodium: 136 mmol/L (ref 134–144)
Total Protein: 7.3 g/dL (ref 6.0–8.5)
eGFR: 53 mL/min/{1.73_m2} — ABNORMAL LOW (ref >59)

## 2021-07-16 ENCOUNTER — Encounter: Payer: Self-pay | Admitting: Physician Assistant

## 2021-07-16 NOTE — Progress Notes (Signed)
 Acute Office Visit  Subjective:    Patient ID: Dawn Harris, female    DOB: 04/11/1940, 81 y.o.   MRN: [id number]  Chief Complaint  Patient presents with   Abdominal Pain    Right and Left Lower Quadrant    HPI: Patient is in today for complaints of bilateral lower abdominal pressure.  It has been off and on for 2 days.  She denies fever, nausea or vomiting.  Last bm was today and denies melena or hematochezia. 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 No pertinent surgical history.  History reviewed. No pertinent family history.  Social History   Socioeconomic History   Marital status: Married    Spouse name: Not on file   Number of children: 2   Years of education: Not on file   Highest education level: Not on file  Occupational History   Occupation: Retired  Tobacco Use   Smoking status: Never   Smokeless tobacco: Never  Vaping Use   Vaping Use: Never used  Substance and Sexual Activity   Alcohol use: Never   Drug use: Never   Sexual activity: Not Currently    Partners: Male  Other Topics Concern   Not on file  Social History Narrative   Not on file   Social Determinants of Health   Financial Resource Strain: Low Risk    Difficulty of Paying Living Expenses: Not hard at all  Food Insecurity: No Food Insecurity   Worried About Running Out of Food in the Last Year: Never true   Ran Out of Food in the Last Year: Never true  Transportation Needs: No Transportation Needs   Lack of Transportation (Medical): No   Lack of Transportation (Non-Medical): No  Physical Activity: Sufficiently Active    Days of Exercise per Week: 5 days   Minutes of Exercise per Session: 30 min  Stress: No Stress Concern Present   Feeling of Stress : Only a little  Social Connections: Socially Integrated   Frequency of Communication with Friends and Family: Twice a week   Frequency of Social Gatherings with Friends and Family: Twice a week   Attends Religious Services: More than 4 times per year   Active Member of Clubs or Organizations: Yes   Attends Club or Organization Meetings: 1 to 4 times per year   Marital Status: Married  Intimate Partner Violence: Not At Risk   Fear of Current or Ex-Partner: No   Emotionally Abused: No   Physically Abused: No   Sexually Abused: No    Outpatient Medications Prior to Visit  Medication Sig Dispense Refill   aspirin EC 81 MG tablet Take 81 mg by mouth daily. Swallow whole.     bisoprolol-hydrochlorothiazide (ZIAC) 2.5-6.25 MG tablet Take 0.5 tablets by mouth daily. 90 tablet 1   calcium-vitamin D (OSCAL WITH D) 500-200 MG-UNIT tablet Take 1 tablet by mouth.     Multiple Vitamins-Minerals (MULTIVITAMIN WITH MINERALS) tablet Take 1 tablet by mouth daily.     No facility-administered medications prior to visit.    Allergies  Allergen Reactions   Penicillins Hives    Review of Systems CONSTITUTIONAL: Negative for chills,   fatigue, fever,- appetite normal E/N/T: Negative for ear pain, nasal congestion and sore throat.  CARDIOVASCULAR: Negative for chest pain, dizziness, palpitations and pedal edema.  RESPIRATORY: Negative for recent cough and dyspnea.  GASTROINTESTINAL: see HPI GU - see HPI      Objective:    PHYSICAL EXAM:   VS: BP 110/70   Pulse 70   Temp 97.6 F (36.4 C)   Resp 15   Ht 5' 3" (1.6 m)   Wt 95 lb (43.1 kg)   LMP  (LMP Unknown)   SpO2 98%   BMI 16.83 kg/m   GEN: Well nourished, well developed, in no acute distress   Oropharynx - normal mucosa, palate, and posterior pharynx  Cardiac: RRR; no murmurs, rubs, or gallops,no  edema  Respiratory:  normal respiratory rate and pattern with no distress - normal breath sounds with no rales, rhonchi, wheezes or rubs GI: normal bowel sounds, mild generalized tenderness to LLQ, RLQ and suprapubic area - no guarding or rebound Skin: warm and dry, no rash   Office Visit on 07/14/2021  Component Date Value Ref Range Status   WBC 07/14/2021 13.5 (H)  3.4 - 10.8 x10E3/uL Final   RBC 07/14/2021 4.62  3.77 - 5.28 x10E6/uL Final   Hemoglobin 07/14/2021 13.8  11.1 - 15.9 g/dL Final   Hematocrit 07/14/2021 40.9  34.0 - 46.6 % Final   MCV 07/14/2021 89  79 - 97 fL Final   MCH 07/14/2021 29.9  26.6 - 33.0 pg Final   MCHC 07/14/2021 33.7  31.5 - 35.7 g/dL Final   RDW 07/14/2021 12.1  11.7 - 15.4 % Final   Platelets 07/14/2021 296  150 - 450 x10E3/uL Final   Neutrophils 07/14/2021 89  Not Estab. % Final   Lymphs 07/14/2021 6  Not Estab. % Final   Monocytes 07/14/2021 4  Not Estab. % Final   Eos 07/14/2021 0  Not Estab. % Final   Basos 07/14/2021 0  Not Estab. % Final   Neutrophils Absolute 07/14/2021 12.0 (H)  1.4 - 7.0 x10E3/uL Final   Lymphocytes Absolute 07/14/2021 0.8  0.7 - 3.1 x10E3/uL Final   Monocytes Absolute 07/14/2021 0.6  0.1 - 0.9 x10E3/uL Final   EOS (ABSOLUTE) 07/14/2021 0.0  0.0 - 0.4 x10E3/uL Final   Basophils Absolute 07/14/2021 0.1  0.0 - 0.2 x10E3/uL Final   Immature Granulocytes 07/14/2021 1  Not Estab. % Final   Immature Grans (Abs) 07/14/2021 0.1  0.0 - 0.1 x10E3/uL Final   Glucose 07/14/2021 140 (H)  70 - 99 mg/dL Final   BUN 07/14/2021 15  8 - 27 mg/dL Final   Creatinine, Ser 07/14/2021 1.05 (H)  0.57 - 1.00 mg/dL Final   eGFR 07/14/2021 53 (L)  >59 mL/min/1.73 Final   BUN/Creatinine Ratio 07/14/2021 14  12 - 28 Final   Sodium 07/14/2021 136  134 - 144 mmol/L Final   Potassium 07/14/2021 4.9  3.5 - 5.2 mmol/L Final   Chloride 07/14/2021 95 (L)  96 - 106 mmol/L Final   CO2 07/14/2021 21  20 - 29 mmol/L Final   Calcium 07/14/2021 10.2  8.7 - 10.3  mg/dL Final   Total Protein 07/14/2021 7.3  6.0 - 8.5 g/dL Final   Albumin 07/14/2021 4.6  3.6 - 4.6 g/dL Final   Globulin, Total 07/14/2021 2.7  1.5 - 4.5 g/dL Final   Albumin/Globulin Ratio 07/14/2021 1.7  1.2 - 2.2 Final   Bilirubin Total 07/14/2021 0.5  0.0 - 1.2 mg/dL Final  Alkaline Phosphatase 07/14/2021 74  44 - 121 IU/L Final   AST 07/14/2021 23  0 - 40 IU/L Final   ALT 07/14/2021 18  0 - 32 IU/L Final   Color, UA 07/14/2021 yellow  yellow Final   Clarity, UA 07/14/2021 cloudy (A)  clear Final   Glucose, UA 07/14/2021 negative  negative mg/dL Final   Bilirubin, UA 07/14/2021 small (A)  negative Final   Ketones, POC UA 07/14/2021 negative  negative mg/dL Final   Spec Grav, UA 07/14/2021 1.015  1.010 - 1.025 Final   Blood, UA 07/14/2021 moderate (A)  negative Final   pH, UA 07/14/2021 6.5  5.0 - 8.0 Final   POC PROTEIN,UA 07/14/2021 =30 (A)  negative, trace Final   Urobilinogen, UA 07/14/2021 1.0  0.2 or 1.0 E.U./dL Final   Nitrite, UA 07/14/2021 Negative  Negative Final   Leukocytes, UA 07/14/2021 Small (1+) (A)  Negative Final       Health Maintenance Due  Topic Date Due   Pneumonia Vaccine 65+ Years old (1 - PCV) Never done   TETANUS/TDAP  Never done   Zoster Vaccines- Shingrix (1 of 2) Never done   DEXA SCAN  Never done   COVID-19 Vaccine (4 - Booster for Pfizer series) 01/26/2020    There are no preventive care reminders to display for this patient.   Lab Results  Component Value Date   TSH 3.850 10/05/2020   Lab Results  Component Value Date   WBC 13.5 (H) 07/14/2021   HGB 13.8 07/14/2021   HCT 40.9 07/14/2021   MCV 89 07/14/2021   PLT 296 07/14/2021   Lab Results  Component Value Date   NA 136 07/14/2021   K 4.9 07/14/2021   CO2 21 07/14/2021   GLUCOSE 140 (H) 07/14/2021   BUN 15 07/14/2021   CREATININE 1.05 (H) 07/14/2021   BILITOT 0.5 07/14/2021   ALKPHOS 74 07/14/2021   AST 23 07/14/2021   ALT 18 07/14/2021   PROT 7.3 07/14/2021    ALBUMIN 4.6 07/14/2021   CALCIUM 10.2 07/14/2021   EGFR 53 (L) 07/14/2021   Lab Results  Component Value Date   CHOL 248 (H) 10/05/2020   Lab Results  Component Value Date   HDL 62 10/05/2020   Lab Results  Component Value Date   LDLCALC 154 (H) 10/05/2020   Lab Results  Component Value Date   TRIG 176 (H) 10/05/2020   Lab Results  Component Value Date   CHOLHDL 4.0 10/05/2020   No results found for: HGBA1C     Assessment & Plan:   Problem List Items Addressed This Visit   None Visit Diagnoses     Lower abdominal pain    -  Primary   Relevant Orders   CBC with Differential/Platelet (Completed)   Comprehensive metabolic panel (Completed)   POCT URINALYSIS DIP (CLINITEK) (Completed)   Urine Culture   Acute cystitis with hematuria       Relevant Medications   ciprofloxacin (CIPRO) 500 MG tablet      Meds ordered this encounter  Medications   ciprofloxacin (CIPRO) 500 MG tablet    Sig: Take 1 tablet (500 mg total) by mouth 2 (two) times daily for 10 days.    Dispense:  20 tablet    Refill:  0    Order Specific Question:   Supervising Provider    Answer:   COX, KIRSTEN [[id number]]    Orders Placed This Encounter  Procedures   Urine Culture     CBC with Differential/Platelet   Comprehensive metabolic panel   POCT URINALYSIS DIP (CLINITEK)     Follow-up: Return for if abdominal pain worsens over weekend recommend to be seen in ED.  An After Visit Summary was printed and given to the patient.  Yetta Flock [facility] [phone]

## 2021-07-18 LAB — URINE CULTURE

## 2021-07-28 ENCOUNTER — Other Ambulatory Visit: Payer: Self-pay | Admitting: Family Medicine

## 2021-07-28 ENCOUNTER — Telehealth: Payer: Self-pay

## 2021-07-28 MED ORDER — CEFDINIR 300 MG PO CAPS
300.0000 mg | ORAL_CAPSULE | Freq: Two times a day (BID) | ORAL | 0 refills | Status: DC
Start: 2021-07-28 — End: 2021-07-31

## 2021-07-28 NOTE — Telephone Encounter (Signed)
Cefdinir 20 mg twice daily for 1 week.  Patient to call Monday if not improved.

## 2021-07-28 NOTE — Telephone Encounter (Signed)
Patient was seen 5/26 for UTI - was treated with Cipro 500 BID #20 -- her symptoms had improved some however since completing the ABT they have worsened again.  She is requesting another round of ABT.

## 2021-07-31 ENCOUNTER — Ambulatory Visit (HOSPITAL_BASED_OUTPATIENT_CLINIC_OR_DEPARTMENT_OTHER)
Admission: RE | Admit: 2021-07-31 | Discharge: 2021-07-31 | Disposition: A | Discharge status: Home / Self Care | Payer: Medicare HMO | Source: Ambulatory Visit | Attending: Family Medicine | Admitting: Family Medicine

## 2021-07-31 ENCOUNTER — Ambulatory Visit (INDEPENDENT_AMBULATORY_CARE_PROVIDER_SITE_OTHER): Payer: Medicare HMO | Admitting: Family Medicine

## 2021-07-31 VITALS — BP 118/60 | HR 78 | Temp 97.1°F | Resp 16 | Ht 63.0 in | Wt 94.4 lb

## 2021-07-31 DIAGNOSIS — R109 Unspecified abdominal pain: Secondary | ICD-10-CM | POA: Diagnosis not present

## 2021-07-31 DIAGNOSIS — R1030 Lower abdominal pain, unspecified: Secondary | ICD-10-CM

## 2021-07-31 DIAGNOSIS — R1032 Left lower quadrant pain: Secondary | ICD-10-CM

## 2021-07-31 DIAGNOSIS — I700 Atherosclerosis of aorta: Secondary | ICD-10-CM | POA: Diagnosis not present

## 2021-07-31 LAB — POCT URINALYSIS DIPSTICK
Bilirubin, UA: NEGATIVE
Glucose, UA: NEGATIVE
Ketones, UA: NEGATIVE
Leukocytes, UA: NEGATIVE
Nitrite, UA: NEGATIVE
Protein, UA: NEGATIVE
Spec Grav, UA: <=1.005 — AB (ref 1.010–1.025)
Urobilinogen, UA: 0.2 E.U./dL
pH, UA: 6 (ref 5.0–8.0)

## 2021-07-31 MED ORDER — METRONIDAZOLE 500 MG PO TABS: 500.0000 mg | ORAL_TABLET | Freq: Two times a day (BID) | ORAL | 0 refills | Status: AC

## 2021-07-31 MED ORDER — CIPROFLOXACIN HCL 500 MG PO TABS: 500.0000 mg | ORAL_TABLET | Freq: Two times a day (BID) | ORAL | 0 refills | Status: AC

## 2021-07-31 MED ORDER — IOHEXOL 300 MG/ML  SOLN
100.0000 mL | Freq: Once | INTRAMUSCULAR | Status: AC | PRN
  Administered 2021-07-31: 100 mL via INTRAVENOUS

## 2021-07-31 NOTE — Progress Notes (Signed)
Acute Office Visit  Subjective:    Patient ID: Dawn Harris, female    DOB: 02/28/1940, 81 y.o.   MRN: [id number]  Chief Complaint  Patient presents with   Abdominal Pain    HPI: Patient is in today for abdominal pain, nausea and fatigue.  Treated for UTI and possible diverticulitis with cipro. Completed 07/23/2021. Sent cefdinir 300 mg twice daily x 10 days. Has not felt better with new antibiotic. Abdominal pain is lower and then spreads upwards. Decreased appetite. No surgeries.   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 No pertinent surgical history.  History reviewed. No pertinent family history.  Social History   Socioeconomic History   Marital status: Married    Spouse name: Not on file   Number of children: 2   Years of education: Not on file   Highest education level: Not on file  Occupational History   Occupation: Retired  Tobacco Use   Smoking status: Never   Smokeless tobacco: Never  Vaping Use   Vaping Use: Never used  Substance and Sexual Activity   Alcohol use: Never   Drug use: Never   Sexual activity: Not Currently    Partners: Male  Other Topics Concern   Not on file  Social History Narrative   Not on file   Social Determinants of Health   Financial Resource Strain: Low Risk  (06/23/2021)   Overall Financial Resource Strain (CARDIA)    Difficulty of Paying Living Expenses: Not hard at all  Food Insecurity: No Food Insecurity (06/23/2021)   Hunger Vital Sign    Worried About Running Out of Food in the Last Year: Never true    [place] in the Last Year: Never true  Transportation Needs: No Transportation Needs (06/23/2021)   PRAPARE - Hydrologist (Medical): No    Lack of Transportation (Non-Medical): No   Physical Activity: Sufficiently Active (06/23/2021)   Exercise Vital Sign    Days of Exercise per Week: 5 days    Minutes of Exercise per Session: 30 min  Stress: No Stress Concern Present (06/23/2021)   [place]    Feeling of Stress : Only a little  Social Connections: Socially Integrated (06/23/2021)   Social Connection and Isolation Panel [NHANES]    Frequency of Communication with Friends and Family: Twice a week    Frequency of Social Gatherings with Friends and Family: Twice a week    Attends Religious Services: More than 4 times per year    Active Member of [employer] or Organizations: Yes    Attends Archivist Meetings: 1 to 4 times per year    Marital Status: Married  Human resources officer Violence: Not At Risk (06/23/2021)   Humiliation, Afraid, Rape, and Kick questionnaire    Fear of Current or Ex-Partner: No    Emotionally Abused: No    Physically Abused: No    Sexually Abused: No    Outpatient Medications Prior to Visit  Medication Sig Dispense Refill   aspirin EC 81 MG tablet Take 81 mg by mouth daily. Swallow whole.     bisoprolol-hydrochlorothiazide (ZIAC) 2.5-6.25 MG tablet Take 0.5 tablets by mouth daily. 90 tablet 1   calcium-vitamin D (OSCAL WITH D) 500-200 MG-UNIT tablet Take  1 tablet by mouth.     Multiple Vitamins-Minerals (MULTIVITAMIN WITH MINERALS) tablet Take 1 tablet by mouth daily.     cefdinir (OMNICEF) 300 MG capsule Take 1 capsule (300 mg total) by mouth 2 (two) times daily. 14 capsule 0   No facility-administered medications prior to visit.    Allergies  Allergen Reactions   Penicillins Hives    Review of Systems  Constitutional:  Positive for fatigue.  Gastrointestinal:  Positive for abdominal pain, constipation and nausea.  Genitourinary:  Negative for dysuria.       Objective:    Physical Exam Vitals reviewed.  Constitutional:      Appearance: She is well-developed.   Cardiovascular:     Rate and Rhythm: Normal rate and regular rhythm.  Pulmonary:     Effort: Pulmonary effort is normal.     Breath sounds: Normal breath sounds.  Abdominal:     General: Bowel sounds are increased.     Palpations: There is no mass.     Tenderness: There is abdominal tenderness in the left lower quadrant. There is no right CVA tenderness or left CVA tenderness.  Neurological:     Mental Status: She is alert.     BP 118/60   Pulse 78   Temp (!) 97.1 F (36.2 C)   Resp 16   Ht 5' 3"  (1.6 m)   Wt 94 lb 6.4 oz (42.8 kg)   LMP  (LMP Unknown)   BMI 16.72 kg/m  Wt Readings from Last 3 Encounters:  07/31/21 94 lb 6.4 oz (42.8 kg)  07/14/21 95 lb (43.1 kg)  06/23/21 96 lb (43.5 kg)    Health Maintenance Due  Topic Date Due   Pneumonia Vaccine 82+ Years old (1 - PCV) Never done   TETANUS/TDAP  Never done   Zoster Vaccines- Shingrix (1 of 2) Never done   DEXA SCAN  Never done   COVID-19 Vaccine (4 - Pfizer series) 01/26/2020    There are no preventive care reminders to display for this patient.   Lab Results  Component Value Date   TSH 3.850 10/05/2020   Lab Results  Component Value Date   WBC 4.6 08/01/2021   HGB 12.4 08/01/2021   HCT 36.4 08/01/2021   MCV 89 08/01/2021   PLT 345 08/01/2021   Lab Results  Component Value Date   NA 136 08/01/2021   K 4.3 08/01/2021   CO2 24 08/01/2021   GLUCOSE 88 08/01/2021   BUN 11 08/01/2021   CREATININE 0.97 08/01/2021   BILITOT 0.2 08/01/2021   ALKPHOS 70 08/01/2021   AST 16 08/01/2021   ALT 10 08/01/2021   PROT 7.2 08/01/2021   ALBUMIN 4.4 08/01/2021   CALCIUM 9.8 08/01/2021   EGFR 59 (L) 08/01/2021   Lab Results  Component Value Date   CHOL 248 (H) 10/05/2020   Lab Results  Component Value Date   HDL 62 10/05/2020   Lab Results  Component Value Date   LDLCALC 154 (H) 10/05/2020   Lab Results  Component Value Date   TRIG 176 (H) 10/05/2020   Lab Results  Component Value Date    CHOLHDL 4.0 10/05/2020   No results found for: "HGBA1C"     Assessment & Plan:   Problem List Items Addressed This Visit       Other   Left lower quadrant abdominal pain - Primary    Sent cipro and flagyl. Stop cefdinir.  Check labs and ct scan of abd/pelvis.  Diverticulitis ruled out on ct scan. Large amount of stool on ct scan consistent with constipation. Recommend miralax until can come and pick up linzess in am.         Relevant Medications   ciprofloxacin (CIPRO) 500 MG tablet   metroNIDAZOLE (FLAGYL) 500 MG tablet   Other Relevant Orders   POCT urinalysis dipstick (Completed)   CT ABDOMEN PELVIS W CONTRAST (Completed)   CBC with Differential/Platelet   Comprehensive metabolic panel   Meds ordered this encounter  Medications   ciprofloxacin (CIPRO) 500 MG tablet    Sig: Take 1 tablet (500 mg total) by mouth 2 (two) times daily for 7 days.    Dispense:  14 tablet    Refill:  0   metroNIDAZOLE (FLAGYL) 500 MG tablet    Sig: Take 1 tablet (500 mg total) by mouth 2 (two) times daily for 7 days.    Dispense:  14 tablet    Refill:  0    Orders Placed This Encounter  Procedures   CT ABDOMEN PELVIS W CONTRAST   CBC with Differential/Platelet   Comprehensive metabolic panel   POCT urinalysis dipstick     Follow-up: Return if symptoms worsen or fail to improve.  An After Visit Summary was printed and given to the patient.  Rochel Brome, MD [facility] [phone]

## 2021-08-01 ENCOUNTER — Ambulatory Visit (HOSPITAL_COMMUNITY): Payer: Medicare HMO

## 2021-08-01 ENCOUNTER — Other Ambulatory Visit: Payer: Self-pay | Admitting: Nurse Practitioner

## 2021-08-01 ENCOUNTER — Ambulatory Visit: Payer: Medicare HMO

## 2021-08-01 DIAGNOSIS — R1032 Left lower quadrant pain: Secondary | ICD-10-CM | POA: Diagnosis not present

## 2021-08-02 LAB — CBC WITH DIFFERENTIAL/PLATELET
Basophils Absolute: 0.1 10*3/uL (ref 0.0–0.2)
Basos: 2 %
EOS (ABSOLUTE): 0.1 10*3/uL (ref 0.0–0.4)
Eos: 1 %
Hematocrit: 36.4 % (ref 34.0–46.6)
Hemoglobin: 12.4 g/dL (ref 11.1–15.9)
Immature Grans (Abs): 0 10*3/uL (ref 0.0–0.1)
Immature Granulocytes: 0 %
Lymphocytes Absolute: 1.3 10*3/uL (ref 0.7–3.1)
Lymphs: 29 %
MCH: 30.3 pg (ref 26.6–33.0)
MCHC: 34.1 g/dL (ref 31.5–35.7)
MCV: 89 fL (ref 79–97)
Monocytes Absolute: 0.4 10*3/uL (ref 0.1–0.9)
Monocytes: 9 %
Neutrophils Absolute: 2.7 10*3/uL (ref 1.4–7.0)
Neutrophils: 59 %
Platelets: 345 10*3/uL (ref 150–450)
RBC: 4.09 x10E6/uL (ref 3.77–5.28)
RDW: 12.1 % (ref 11.7–15.4)
WBC: 4.6 10*3/uL (ref 3.4–10.8)

## 2021-08-02 LAB — COMPREHENSIVE METABOLIC PANEL
ALT: 10 IU/L (ref 0–32)
AST: 16 IU/L (ref 0–40)
Albumin/Globulin Ratio: 1.6 (ref 1.2–2.2)
Albumin: 4.4 g/dL (ref 3.6–4.6)
Alkaline Phosphatase: 70 IU/L (ref 44–121)
BUN/Creatinine Ratio: 11 — ABNORMAL LOW (ref 12–28)
BUN: 11 mg/dL (ref 8–27)
Bilirubin Total: 0.2 mg/dL (ref 0.0–1.2)
CO2: 24 mmol/L (ref 20–29)
Calcium: 9.8 mg/dL (ref 8.7–10.3)
Chloride: 98 mmol/L (ref 96–106)
Creatinine, Ser: 0.97 mg/dL (ref 0.57–1.00)
Globulin, Total: 2.8 g/dL (ref 1.5–4.5)
Glucose: 88 mg/dL (ref 70–99)
Potassium: 4.3 mmol/L (ref 3.5–5.2)
Sodium: 136 mmol/L (ref 134–144)
Total Protein: 7.2 g/dL (ref 6.0–8.5)
eGFR: 59 mL/min/{1.73_m2} — ABNORMAL LOW (ref >59)

## 2021-08-05 ENCOUNTER — Encounter: Payer: Self-pay | Admitting: Family Medicine

## 2021-08-05 DIAGNOSIS — R1032 Left lower quadrant pain: Secondary | ICD-10-CM | POA: Insufficient documentation

## 2021-08-05 NOTE — Assessment & Plan Note (Addendum)
Sent cipro and flagyl. Stop cefdinir.  Check labs and ct scan of abd/pelvis.  Diverticulitis ruled out on ct scan. Large amount of stool on ct scan consistent with constipation. Recommend miralax until can come and pick up linzess in am.

## 2021-08-07 ENCOUNTER — Ambulatory Visit (INDEPENDENT_AMBULATORY_CARE_PROVIDER_SITE_OTHER): Payer: Medicare HMO

## 2021-08-07 DIAGNOSIS — N3001 Acute cystitis with hematuria: Secondary | ICD-10-CM

## 2021-08-07 LAB — POCT URINALYSIS DIPSTICK
Bilirubin, UA: NEGATIVE
Blood, UA: NEGATIVE
Glucose, UA: NEGATIVE
Ketones, UA: NEGATIVE
Leukocytes, UA: NEGATIVE
Nitrite, UA: NEGATIVE
Protein, UA: NEGATIVE
Spec Grav, UA: 1.01 (ref 1.010–1.025)
Urobilinogen, UA: 0.2 E.U./dL
pH, UA: 8 (ref 5.0–8.0)

## 2021-08-07 NOTE — Progress Notes (Cosign Needed)
Mrs. Wich comes in for recheck of her urine.

## 2021-08-07 NOTE — Patient Instructions (Signed)
Recheck in the office if symptoms return

## 2021-09-04 ENCOUNTER — Ambulatory Visit: Payer: Medicare HMO | Admitting: Podiatry

## 2021-09-05 ENCOUNTER — Telehealth: Payer: Self-pay

## 2021-09-05 DIAGNOSIS — I10 Essential (primary) hypertension: Secondary | ICD-10-CM

## 2021-09-05 NOTE — Telephone Encounter (Signed)
Patient called back and her husband has been released today, she states no need to send cause she is going home!

## 2021-09-05 NOTE — Telephone Encounter (Signed)
Patient called stating that her husband is in the hospital in [place], [place] the beach. She states that she only brought enough BP medication for their trip only and she has been out of the bisoprolol/HCTZ now for 2 days, and she is not sure how long they are keeping her husband cause she states they could keep him until Friday. Patient is wanting to know if we could send just a few day supply to [facility] in [place], [place]. She states she is afraid to go without her BP medication. Please Advise!    **Call back number [phone]

## 2021-11-17 ENCOUNTER — Ambulatory Visit: Payer: Medicare HMO

## 2021-11-17 DIAGNOSIS — Z23 Encounter for immunization: Secondary | ICD-10-CM

## 2021-12-04 ENCOUNTER — Ambulatory Visit: Payer: Medicare HMO | Admitting: Podiatry

## 2021-12-04 DIAGNOSIS — B351 Tinea unguium: Secondary | ICD-10-CM | POA: Diagnosis not present

## 2021-12-04 DIAGNOSIS — M79609 Pain in unspecified limb: Secondary | ICD-10-CM | POA: Diagnosis not present

## 2021-12-04 DIAGNOSIS — I739 Peripheral vascular disease, unspecified: Secondary | ICD-10-CM

## 2021-12-04 NOTE — Progress Notes (Signed)
  Subjective:  Patient ID: Dawn Harris, female    DOB: [birth year],  MRN: [id number]  Chief Complaint  Patient presents with   Nail Problem    Thick painful toenails, 3 month follow up     81 y.o. female presents with the above complaint. History confirmed with patient. Patient presenting with pain related to dystrophic thickened elongated nails. Patient is unable to trim own nails related to nail dystrophy and/or mobility issues. Patient does not have a history of T2DM.   Objective:  Physical Exam: warm, good capillary refill, DP and PT pulses 1/4 bilateral nail exam onychomycosis of the toenails, onycholysis, and dystrophic nails DP pulses palpable, PT pulses palpable, and protective sensation intact Left Foot:  Pain with palpation of nails due to elongation and dystrophic growth.  Right Foot: Pain with palpation of nails due to elongation and dystrophic growth.   Assessment:   1. Pain due to onychomycosis of nail   2. PVD (peripheral vascular disease) ([place])      Plan:  Patient was evaluated and treated and all questions answered.  #Onychomycosis with pain  -Nails palliatively debrided as below. -Educated on self-care  Procedure: Nail Debridement Rationale: Pain Type of Debridement: manual, sharp debridement. Instrumentation: Nail nipper, rotary burr. Number of Nails: 10  Return in about 3 months (around 03/06/2022) for RFC.         Everitt Amber, DPM [facility]

## 2022-01-20 ENCOUNTER — Other Ambulatory Visit: Payer: Self-pay | Admitting: Family Medicine

## 2022-01-20 DIAGNOSIS — I10 Essential (primary) hypertension: Secondary | ICD-10-CM

## 2022-02-07 DIAGNOSIS — H43393 Other vitreous opacities, bilateral: Secondary | ICD-10-CM | POA: Diagnosis not present

## 2022-02-07 DIAGNOSIS — H524 Presbyopia: Secondary | ICD-10-CM | POA: Diagnosis not present

## 2022-02-07 DIAGNOSIS — H31103 Choroidal degeneration, unspecified, bilateral: Secondary | ICD-10-CM | POA: Diagnosis not present

## 2022-02-07 DIAGNOSIS — H18413 Arcus senilis, bilateral: Secondary | ICD-10-CM | POA: Diagnosis not present

## 2022-02-07 DIAGNOSIS — Z961 Presence of intraocular lens: Secondary | ICD-10-CM | POA: Diagnosis not present

## 2022-02-07 DIAGNOSIS — H40013 Open angle with borderline findings, low risk, bilateral: Secondary | ICD-10-CM | POA: Diagnosis not present

## 2022-03-12 ENCOUNTER — Ambulatory Visit: Payer: Medicare HMO | Admitting: Podiatry

## 2022-12-09 NOTE — Progress Notes (Unsigned)
Subjective:   Dawn Harris is a 82 y.o. female who presents for Medicare Annual (Subsequent) preventive examination.  Visit Complete: {VISITMETHODVS:[phone]}  Patient Medicare AWV questionnaire was completed by the patient on ***; I have confirmed that all information answered by patient is correct and no changes since this date.        Objective:    There were no vitals filed for this visit. There is no height or weight on file to calculate BMI.     06/23/2021    8:56 AM  Advanced Directives  Does Patient Have a Medical Advance Directive? No  Would patient like information on creating a medical advance directive? No - Patient declined    Current Medications (verified) Outpatient Encounter Medications as of 12/10/2022  Medication Sig   aspirin EC 81 MG tablet Take 81 mg by mouth daily. Swallow whole.   bisoprolol-hydrochlorothiazide (ZIAC) 2.5-6.25 MG tablet TAKE 1/2 TABLET EVERY DAY   calcium-vitamin D (OSCAL WITH D) 500-200 MG-UNIT tablet Take 1 tablet by mouth.   Multiple Vitamins-Minerals (MULTIVITAMIN WITH MINERALS) tablet Take 1 tablet by mouth daily.   No facility-administered encounter medications on file as of 12/10/2022.    Allergies (verified) Penicillins   History: 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   Diverticulosis 10/19/2019   Essential hypertension 10/19/2019   GERD (gastroesophageal reflux disease) 10/19/2019   Internal hemorrhoid 10/19/2019   No past surgical history on file. No family history on file. Social History   Socioeconomic History   Marital status: Married    Spouse name: Not on file   Number of children: 2   Years of education: Not on file   Highest education level: Not on file  Occupational History   Occupation: Retired  Tobacco Use   Smoking status: Never   Smokeless tobacco: Never  Vaping Use   Vaping status: Never Used  Substance and Sexual Activity   Alcohol use: Never   Drug  use: Never   Sexual activity: Not Currently    Partners: Male  Other Topics Concern   Not on file  Social History Narrative   Not on file   Social Determinants of Health   Financial Resource Strain: Low Risk  (06/23/2021)   Overall Financial Resource Strain (CARDIA)    Difficulty of Paying Living Expenses: Not hard at all  Food Insecurity: No Food Insecurity (06/23/2021)   Hunger Vital Sign    Worried About Running Out of Food in the Last Year: Never true    Ran Out of Food in the Last Year: Never true  Transportation Needs: No Transportation Needs (06/23/2021)   PRAPARE - Administrator, Civil Service (Medical): No    Lack of Transportation (Non-Medical): No  Physical Activity: Sufficiently Active (06/23/2021)   Exercise Vital Sign    Days of Exercise per Week: 5 days    Minutes of Exercise per Session: 30 min  Stress: No Stress Concern Present (06/23/2021)   [employer] of Occupational Health - Occupational Stress Questionnaire    Feeling of Stress : Only a little  Social Connections: Socially Integrated (06/23/2021)   Social Connection and Isolation Panel [NHANES]    Frequency of Communication with Friends and Family: Twice a week    Frequency of Social Gatherings with Friends and Family: Twice a week    Attends Religious Services: More than 4 times per year    Active Member of [employer] or Organizations: Yes    Attends [employer]  or Organization Meetings: 1 to 4 times per year    Marital Status: Married    Tobacco Counseling Counseling given: Not Answered   Clinical Intake:              How often do you need to have someone help you when you read instructions, pamphlets, or other written materials from your doctor or pharmacy?: (P) 2 - Rarely         Activities of Daily Living    12/07/2022    1:09 PM  In your present state of health, do you have any difficulty performing the following activities:  Hearing? 0  Vision? 0  Walking or climbing stairs? 0   Dressing or bathing? 0  Doing errands, shopping? 0  Preparing Food and eating ? N  Using the Toilet? N  In the past six months, have you accidently leaked urine? Y  Do you have problems with loss of bowel control? N  Managing your Medications? N  Managing your Finances? N  Housekeeping or managing your Housekeeping? N    Patient Care Team: CoxFritzi Mandes, MD as PCP - General (Family Medicine)  Indicate any recent Medical Services you may have received from other than [facility] providers in the past year (date may be approximate).     Assessment:   This is a routine wellness examination for Dawn Harris.  Hearing/Vision screen No results found.   Goals Addressed   None   Depression Screen    06/23/2021    8:56 AM 10/05/2020    9:20 AM 10/19/2019    2:23 PM  PHQ 2/9 Scores  PHQ - 2 Score 0 0 0    Fall Risk    12/07/2022    1:09 PM 06/23/2021    8:56 AM 10/05/2020    9:20 AM  Fall Risk   Falls in the past year?  0 1  Number falls in past yr: 0 0 0  Injury with Fall? 0 0 1  Risk for fall due to :  No Fall Risks     MEDICARE RISK AT HOME: Medicare Risk at Home Home free of loose throw rugs in walkways, pet beds, electrical cords, etc?: (P) Yes Adequate lighting in your home to reduce risk of falls?: (P) Yes Life alert?: (P) No Use of a cane, walker or w/c?: (P) No Grab bars in the bathroom?: (P) Yes Shower chair or bench in shower?: (P) Yes Elevated toilet seat or a handicapped toilet?: (P) Yes  TIMED UP AND GO:  Was the test performed?  {AMBTIMEDUPGO:[id number]}    Cognitive Function:        06/23/2021    8:58 AM  6CIT Screen  What Year? 0 points  What month? 0 points  What time? 0 points  Count back from 20 0 points  Months in reverse 0 points  Repeat phrase 0 points  Total Score 0 points    Immunizations Immunization History  Administered Date(s) Administered   Fluad Quad(high Dose 65+) 11/17/2021   PFIZER(Purple Top)SARS-COV-2 Vaccination 04/13/2019,  05/04/2019, 12/01/2019    {TDAP status:2101805}  {Flu Vaccine status:2101806}  {Pneumococcal vaccine status:2101807}  {Covid-19 vaccine status:2101808}  Qualifies for Shingles Vaccine? {YES/NO:21197}  Zostavax completed {YES/NO:21197}  {Shingrix Completed?:2101804}  Screening Tests Health Maintenance  Topic Date Due   DTaP/Tdap/Td (1 - Tdap) Never done   Zoster Vaccines- Shingrix (1 of 2) Never done   Pneumonia Vaccine 15+ Years old (1 of 1 - PCV) Never done   DEXA SCAN  Never  done   INFLUENZA VACCINE  09/20/2022   COVID-19 Vaccine (4 - 2023-24 season) 10/21/2022   Medicare Annual Wellness (AWV)  12/10/2023   HPV VACCINES  Aged Out    Health Maintenance  Health Maintenance Due  Topic Date Due   DTaP/Tdap/Td (1 - Tdap) Never done   Zoster Vaccines- Shingrix (1 of 2) Never done   Pneumonia Vaccine 77+ Years old (1 of 1 - PCV) Never done   DEXA SCAN  Never done   INFLUENZA VACCINE  09/20/2022   COVID-19 Vaccine (4 - 2023-24 season) 10/21/2022    {Colorectal cancer screening:2101809}  {Mammogram status:21018020}  {Bone Density status:21018021}  Lung Cancer Screening: (Low Dose CT Chest recommended if Age 39-80 years, 20 pack-year currently smoking OR have quit w/in 15years.) {DOES NOT does:27190::"does not"} qualify.   Lung Cancer Screening Referral: ***  Additional Screening:  Hepatitis C Screening: {DOES NOT does:27190::"does not"} qualify; Completed ***  Vision Screening: Recommended annual ophthalmology exams for early detection of glaucoma and other disorders of the eye. Is the patient up to date with their annual eye exam?  {YES/NO:21197} Who is the provider or what is the name of the office in which the patient attends annual eye exams? *** If pt is not established with a provider, would they like to be referred to a provider to establish care? {YES/NO:21197}.   Dental Screening: Recommended annual dental exams for proper oral hygiene  Diabetic Foot  Exam: {Diabetic Foot Exam:2101802}  Community Resource Referral / Chronic Care Management: CRR required this visit?  {YES/NO:21197}  CCM required this visit?  {CCM Required choices:[phone]}     Plan:     I have personally reviewed and noted the following in the patient's chart:   Medical and social history Use of alcohol, tobacco or illicit drugs  Current medications and supplements including opioid prescriptions. {Opioid Prescriptions:[phone]} Functional ability and status Nutritional status Physical activity Advanced directives List of other physicians Hospitalizations, surgeries, and ER visits in previous 12 months Vitals Screenings to include cognitive, depression, and falls Referrals and appointments  In addition, I have reviewed and discussed with patient certain preventive protocols, quality metrics, and best practice recommendations. A written personalized care plan for preventive services as well as general preventive health recommendations were provided to patient.     Eugenie Norrie, CMA   12/09/2022   After Visit Summary: {CHL AMB AWV After Visit Summary:[phone]}  Nurse Notes: ***

## 2022-12-09 NOTE — Patient Instructions (Signed)

## 2022-12-10 ENCOUNTER — Ambulatory Visit: Payer: Medicare HMO | Admitting: Family Medicine

## 2022-12-10 ENCOUNTER — Encounter: Payer: Self-pay | Admitting: Family Medicine

## 2022-12-10 VITALS — BP 120/68 | HR 63 | Temp 97.2°F | Ht 64.0 in | Wt 90.0 lb

## 2022-12-10 DIAGNOSIS — R011 Cardiac murmur, unspecified: Secondary | ICD-10-CM

## 2022-12-10 DIAGNOSIS — Z780 Asymptomatic menopausal state: Secondary | ICD-10-CM | POA: Diagnosis not present

## 2022-12-10 DIAGNOSIS — Z0000 Encounter for general adult medical examination without abnormal findings: Secondary | ICD-10-CM

## 2022-12-10 DIAGNOSIS — E782 Mixed hyperlipidemia: Secondary | ICD-10-CM

## 2022-12-10 DIAGNOSIS — I10 Essential (primary) hypertension: Secondary | ICD-10-CM

## 2022-12-10 DIAGNOSIS — Z23 Encounter for immunization: Secondary | ICD-10-CM | POA: Diagnosis not present

## 2022-12-10 DIAGNOSIS — Z13820 Encounter for screening for osteoporosis: Secondary | ICD-10-CM

## 2022-12-10 HISTORY — DX: Encounter for general adult medical examination without abnormal findings: Z00.00

## 2022-12-10 HISTORY — DX: Mixed hyperlipidemia: E78.2

## 2022-12-10 HISTORY — DX: Cardiac murmur, unspecified: R01.1

## 2022-12-10 HISTORY — DX: Encounter for screening for osteoporosis: Z13.820

## 2022-12-10 NOTE — Assessment & Plan Note (Signed)
Order dexa.  

## 2022-12-10 NOTE — Assessment & Plan Note (Signed)
Ordering echocardiogram for heart murmur.

## 2022-12-10 NOTE — Assessment & Plan Note (Signed)
Well controlled.  No changes to medicines. Continue bisoprolol-hydrochlorothiazide 2.5-6.25 mg 1/2 pill daily and aspirin 81 mg daily. Continue to work on eating a healthy diet and exercise.  Labs drawn today.

## 2022-12-10 NOTE — Assessment & Plan Note (Signed)
Increase calcium with D to twice daily.  Recommend monthly breast exams.  Ordering bone density exam.  Recommend return in 2 weeks for a flu shot. I also recommend a pneumonia shot.  Recommend shingles vaccine series at the pharmacy.

## 2022-12-10 NOTE — Addendum Note (Signed)
Addended byBlane Ohara on: 12/10/2022 09:04 AM   Modules accepted: Level of Service

## 2022-12-10 NOTE — Assessment & Plan Note (Signed)
Heart healthy diet

## 2022-12-11 LAB — COMPREHENSIVE METABOLIC PANEL
ALT: 15 [IU]/L (ref 0–32)
AST: 22 [IU]/L (ref 0–40)
Albumin: 4.7 g/dL (ref 3.7–4.7)
Alkaline Phosphatase: 81 [IU]/L (ref 44–121)
BUN/Creatinine Ratio: 13 (ref 12–28)
BUN: 12 mg/dL (ref 8–27)
Bilirubin Total: 0.5 mg/dL (ref 0.0–1.2)
CO2: 23 mmol/L (ref 20–29)
Calcium: 10 mg/dL (ref 8.7–10.3)
Chloride: 103 mmol/L (ref 96–106)
Creatinine, Ser: 0.9 mg/dL (ref 0.57–1.00)
Globulin, Total: 2.5 g/dL (ref 1.5–4.5)
Glucose: 100 mg/dL — ABNORMAL HIGH (ref 70–99)
Potassium: 5.4 mmol/L — ABNORMAL HIGH (ref 3.5–5.2)
Sodium: 142 mmol/L (ref 134–144)
Total Protein: 7.2 g/dL (ref 6.0–8.5)
eGFR: 64 mL/min/{1.73_m2} (ref >59)

## 2022-12-11 LAB — CBC WITH DIFFERENTIAL/PLATELET
Basophils Absolute: 0.1 10*3/uL (ref 0.0–0.2)
Basos: 1 %
EOS (ABSOLUTE): 0.1 10*3/uL (ref 0.0–0.4)
Eos: 2 %
Hematocrit: 40 % (ref 34.0–46.6)
Hemoglobin: 13.7 g/dL (ref 11.1–15.9)
Immature Grans (Abs): 0 10*3/uL (ref 0.0–0.1)
Immature Granulocytes: 0 %
Lymphocytes Absolute: 1.5 10*3/uL (ref 0.7–3.1)
Lymphs: 26 %
MCH: 31.6 pg (ref 26.6–33.0)
MCHC: 34.3 g/dL (ref 31.5–35.7)
MCV: 92 fL (ref 79–97)
Monocytes Absolute: 0.5 10*3/uL (ref 0.1–0.9)
Monocytes: 8 %
Neutrophils Absolute: 3.9 10*3/uL (ref 1.4–7.0)
Neutrophils: 63 %
Platelets: 290 10*3/uL (ref 150–450)
RBC: 4.34 x10E6/uL (ref 3.77–5.28)
RDW: 11.5 % — ABNORMAL LOW (ref 11.7–15.4)
WBC: 6 10*3/uL (ref 3.4–10.8)

## 2022-12-11 LAB — LIPID PANEL
Chol/HDL Ratio: 3.2 ratio (ref 0.0–4.4)
Cholesterol, Total: 247 mg/dL — ABNORMAL HIGH (ref 100–199)
HDL: 78 mg/dL (ref >39)
LDL Chol Calc (NIH): 146 mg/dL — ABNORMAL HIGH (ref 0–99)
Triglycerides: 134 mg/dL (ref 0–149)
VLDL Cholesterol Cal: 23 mg/dL (ref 5–40)

## 2022-12-11 LAB — TSH: TSH: 3.62 u[IU]/mL (ref 0.450–4.500)

## 2023-01-02 DIAGNOSIS — H524 Presbyopia: Secondary | ICD-10-CM | POA: Diagnosis not present

## 2023-01-02 DIAGNOSIS — H43393 Other vitreous opacities, bilateral: Secondary | ICD-10-CM | POA: Diagnosis not present

## 2023-01-02 DIAGNOSIS — H31103 Choroidal degeneration, unspecified, bilateral: Secondary | ICD-10-CM | POA: Diagnosis not present

## 2023-01-02 DIAGNOSIS — Z961 Presence of intraocular lens: Secondary | ICD-10-CM | POA: Diagnosis not present

## 2023-01-02 DIAGNOSIS — H40013 Open angle with borderline findings, low risk, bilateral: Secondary | ICD-10-CM | POA: Diagnosis not present

## 2023-01-02 DIAGNOSIS — H18413 Arcus senilis, bilateral: Secondary | ICD-10-CM | POA: Diagnosis not present

## 2023-01-10 ENCOUNTER — Ambulatory Visit: Payer: Medicare HMO | Attending: Family Medicine

## 2023-01-10 ENCOUNTER — Ambulatory Visit: Payer: Medicare HMO | Admitting: Podiatry

## 2023-01-10 DIAGNOSIS — B351 Tinea unguium: Secondary | ICD-10-CM | POA: Diagnosis not present

## 2023-01-10 DIAGNOSIS — I739 Peripheral vascular disease, unspecified: Secondary | ICD-10-CM | POA: Diagnosis not present

## 2023-01-10 DIAGNOSIS — R011 Cardiac murmur, unspecified: Secondary | ICD-10-CM

## 2023-01-10 DIAGNOSIS — M79609 Pain in unspecified limb: Secondary | ICD-10-CM | POA: Diagnosis not present

## 2023-01-10 LAB — ECHOCARDIOGRAM COMPLETE
Area-P 1/2: 4.29 cm2
MV M vel: 5.79 m/s
MV Peak grad: 134.1 mm[Hg]
S' Lateral: 2.5 cm

## 2023-01-10 NOTE — Progress Notes (Signed)
       Subjective:  Patient ID: Dawn Harris, female    DOB: 05/06/1940,  MRN: [id number]  [name] presents to clinic today for:  Chief Complaint  Patient presents with   [facility]    RFC   Patient notes nails are thick and elongated, causing pain in shoe gear when ambulating.    PCP is Cox, Fritzi Mandes, MD.  Last seen 12/10/22  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   Diverticulosis 10/19/2019   Essential hypertension 10/19/2019   GERD (gastroesophageal reflux disease) 10/19/2019   Internal hemorrhoid 10/19/2019    Allergies  Allergen Reactions   Penicillins Hives   Objective:  Dawn Harris is a pleasant 82 y.o. female in NAD. AAO x 3.  Vascular Examination: Patient has palpable DP pulse, absent PT pulse bilateral.  Delayed capillary refill bilateral toes.  Sparse digital hair bilateral.  Proximal to distal cooling WNL bilateral.    Dermatological Examination: Interspaces are clear with no open lesions noted bilateral.  Skin is shiny and atrophic bilateral.  Nails are 3-49mm thick, with yellowish/brown discoloration, subungual debris and distal onycholysis x10.  There is pain with compression of nails x10.    Patient qualifies for at-risk foot care because of PVD .  Assessment/Plan: 1. Pain due to onychomycosis of nail   2. PVD (peripheral vascular disease) ([facility])     Mycotic nails x10 were sharply debrided with sterile nail nippers and power debriding burr to decrease bulk and length.  Return in about 3 months (around 04/12/2023) for RFC.   Clerance Lav, DPM, FACFAS [facility]     [address] [place], [place] [address]                Office [phone]  Fax [phone]

## 2023-01-16 ENCOUNTER — Ambulatory Visit: Payer: Medicare HMO | Admitting: Physician Assistant

## 2023-01-16 VITALS — BP 160/70 | HR 67 | Temp 97.4°F | Resp 14 | Ht 64.0 in | Wt 92.0 lb

## 2023-01-16 DIAGNOSIS — R300 Dysuria: Secondary | ICD-10-CM

## 2023-01-16 DIAGNOSIS — M810 Age-related osteoporosis without current pathological fracture: Secondary | ICD-10-CM | POA: Diagnosis not present

## 2023-01-16 DIAGNOSIS — M545 Low back pain, unspecified: Secondary | ICD-10-CM

## 2023-01-16 DIAGNOSIS — I340 Nonrheumatic mitral (valve) insufficiency: Secondary | ICD-10-CM | POA: Diagnosis not present

## 2023-01-16 LAB — POCT URINALYSIS DIP (CLINITEK)
Bilirubin, UA: NEGATIVE
Glucose, UA: NEGATIVE mg/dL
Ketones, POC UA: NEGATIVE mg/dL
Leukocytes, UA: NEGATIVE
Nitrite, UA: NEGATIVE
POC PROTEIN,UA: NEGATIVE
Spec Grav, UA: 1.01 (ref 1.010–1.025)
Urobilinogen, UA: 0.2 U/dL
pH, UA: 6.5 (ref 5.0–8.0)

## 2023-01-16 NOTE — Patient Instructions (Signed)
VISIT SUMMARY:  During today's visit, we discussed your back pain, dysuria, osteoporosis, and mitral regurgitation. We reviewed your symptoms and made a plan to manage each condition. We also discussed general health maintenance and your upcoming bone density test.  YOUR PLAN:  -BACK PAIN: Your back pain is likely due to muscle strain, possibly from carrying wood. It is worse in the morning and at night but improves with movement. Continue taking ibuprofen 400mg  every 8 hours for 2 weeks. If the pain does not improve after 1 week, increase the dose to 800mg  for the remaining week. Use a heating pad before bed. If the pain persists, we may consider a back x-ray and physical therapy.  -DYSURIA: Dysuria means painful or difficult urination. Your urinalysis showed minor amounts of blood but no bacteria. We will culture your urine, and if bacteria are present, we will start treatment for a urinary tract infection (UTI).  -OSTEOPOROSIS: Osteoporosis is a condition where bones become weak and brittle. Continue taking your calcium supplements. You are scheduled for a bone density test in December to assess your bone health.  -MITRAL REGURGITATION: Mitral regurgitation is a condition where the heart's mitral valve does not close tightly, which can cause blood to flow backward in the heart. Your recent echocardiogram showed this condition, but it is not currently affecting your heart function. No immediate intervention is required, but we will monitor it.  -GENERAL HEALTH MAINTENANCE: Continue taking Clearlax for constipation as needed. Report any abnormal stomach pain for further investigation, especially given your family history of pancreatic cancer.  INSTRUCTIONS:  Please follow up with [place] if your back pain persists despite the increased dose of ibuprofen, or if you experience any new or worsening symptoms. We will contact you with the results of your urine culture. Your bone density test is scheduled  for December. Continue monitoring your heart condition and report any new symptoms.

## 2023-01-16 NOTE — Progress Notes (Signed)
Subjective:  Patient ID: Dawn Harris, female    DOB: 04/07/1940  Age: 82 y.o. MRN: [id number]  Chief Complaint  Patient presents with   Back Pain    HPI   Patient is here for low back pain since 4 days ago after she moved something heavy and then 2 days ago, she noticed dysuria and nauseas.   Discussed the use of AI scribe software for clinical note transcription with the patient, who gave verbal consent to proceed.  History of Present Illness   The patient, an 82 year old female with a history of osteoporosis, presents with dysuria and back pain. The back pain is described as a stabbing sensation, but not severe, and is located in the middle of her back. The pain is worse at night and in the morning, and improves with movement during the day. The patient reports that the pain is more tolerable when standing up compared to sitting or lying down. She also mentions that the pain seems to radiate through to her stomach. The patient has been self-managing the pain with ibuprofen and has noticed some improvement. She also reports that applying heat to the area provides some relief. The patient has a history of osteoporosis and is concerned that her back pain might be related to this condition. She is scheduled for a bone density test in the near future. The patient also mentions that her father had pancreatic cancer, which is a concern for her.          12/10/2022    8:02 AM 06/23/2021    8:56 AM 10/05/2020    9:20 AM 10/19/2019    2:23 PM  Depression screen PHQ 2/9  Decreased Interest 0 0 0 0  Down, Depressed, Hopeless 0 0 0 0  PHQ - 2 Score 0 0 0 0        12/07/2022    1:09 PM  Fall Risk   Falls in the past year? 0  Number falls in past yr: 0  Injury with Fall? 0  Risk for fall due to : No Fall Risks  Follow up Falls evaluation completed    Patient Care Team: Blane Ohara, MD as PCP - General (Family Medicine)   Review of Systems  Constitutional:  Negative for chills,  fatigue and fever.  HENT:  Negative for congestion, ear pain and sore throat.   Respiratory:  Negative for cough and shortness of breath.   Cardiovascular:  Negative for chest pain and palpitations.  Gastrointestinal:  Positive for nausea. Negative for abdominal pain, constipation, diarrhea and vomiting.  Endocrine: Negative for polydipsia, polyphagia and polyuria.  Genitourinary:  Positive for dysuria. Negative for difficulty urinating.  Musculoskeletal:  Positive for back pain. Negative for arthralgias and myalgias.  Skin:  Negative for rash.  Neurological:  Negative for headaches.  Psychiatric/Behavioral:  Negative for dysphoric mood. The patient is not nervous/anxious.     Current Outpatient Medications on File Prior to Visit  Medication Sig Dispense Refill   aspirin EC 81 MG tablet Take 81 mg by mouth daily. Swallow whole.     bisoprolol-hydrochlorothiazide (ZIAC) 2.5-6.25 MG tablet TAKE 1/2 TABLET EVERY DAY 45 tablet 3   calcium-vitamin D (OSCAL WITH D) 500-200 MG-UNIT tablet Take 1 tablet by mouth.     Multiple Vitamins-Minerals (MULTIVITAMIN WITH MINERALS) tablet Take 1 tablet by mouth daily.     Polyethylene Glycol 3350 (MIRALAX PO)      No current facility-administered medications on file prior to visit.   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 No pertinent surgical history.  History reviewed. No pertinent family history. Social History   Socioeconomic History   Marital status: Married    Spouse name: Not on file   Number of children: 2   Years of education: Not on file   Highest education level: 12th grade  Occupational History   Occupation: Retired  Tobacco Use   Smoking status: Never   Smokeless tobacco: Never  Vaping Use   Vaping status: Never Used   Substance and Sexual Activity   Alcohol use: Never   Drug use: Never   Sexual activity: Not Currently    Partners: Male  Other Topics Concern   Not on file  Social History Narrative   Not on file   Social Determinants of Health   Financial Resource Strain: Low Risk  (01/16/2023)   Overall Financial Resource Strain (CARDIA)    Difficulty of Paying Living Expenses: Not very hard  Food Insecurity: No Food Insecurity (01/16/2023)   Hunger Vital Sign    Worried About Running Out of Food in the Last Year: Never true    Ran Out of Food in the Last Year: Never true  Transportation Needs: No Transportation Needs (01/16/2023)   PRAPARE - Administrator, Civil Service (Medical): No    Lack of Transportation (Non-Medical): No  Physical Activity: Insufficiently Active (01/16/2023)   Exercise Vital Sign    Days of Exercise per Week: 4 days    Minutes of Exercise per Session: 30 min  Stress: No Stress Concern Present (01/16/2023)   [employer] of Occupational Health - Occupational Stress Questionnaire    Feeling of Stress : Only a little  Social Connections: Socially Integrated (01/16/2023)   Social Connection and Isolation Panel [NHANES]    Frequency of Communication with Friends and Family: More than three times a week    Frequency of Social Gatherings with Friends and Family: Patient declined    Attends Religious Services: More than 4 times per year    Active Member of [employer] or Organizations: Yes    Attends Engineer, structural: More than 4 times per year    Marital Status: Married    Objective:  BP (!) 160/70   Pulse 67   Temp (!) 97.4 F (36.3 C)   Resp 14   Ht 5\' 4"  (1.626 m)   Wt 92 lb (41.7 kg)   LMP  (LMP Unknown)   SpO2 97%   BMI 15.79 kg/m      01/16/2023    2:40 PM 12/10/2022    8:01 AM 07/31/2021    1:44 PM  BP/Weight  Systolic BP 160 120 118  Diastolic BP 70 68 60  Wt. (Lbs) 92 90 94.4  BMI 15.79 kg/m2 15.45 kg/m2 16.72 kg/m2     Physical Exam Vitals reviewed.  Constitutional:      Appearance: Normal appearance.  Cardiovascular:     Rate and Rhythm: Normal rate and regular rhythm.     Heart sounds: Normal heart sounds.  Pulmonary:     Effort: Pulmonary effort is normal.     Breath sounds: Normal breath sounds.  Abdominal:     General: Bowel sounds are normal.     Palpations: Abdomen is soft.     Tenderness: There is no abdominal tenderness.  Neurological:     Mental Status: She is alert and oriented to person, place, and time.  Psychiatric:        Mood and Affect: Mood normal.        Behavior: Behavior normal.     Diabetic Foot Exam - Simple   No data filed      Lab Results  Component Value Date   WBC 6.0 12/10/2022   HGB 13.7 12/10/2022   HCT 40.0 12/10/2022   PLT 290 12/10/2022   GLUCOSE 100 (H) 12/10/2022   CHOL 247 (H) 12/10/2022   TRIG 134 12/10/2022   HDL 78 12/10/2022   LDLCALC 146 (H) 12/10/2022   ALT 15 12/10/2022   AST 22 12/10/2022   NA 142 12/10/2022   K 5.4 (H) 12/10/2022   CL 103 12/10/2022   CREATININE 0.90 12/10/2022   BUN 12 12/10/2022   CO2 23 12/10/2022   TSH 3.620 12/10/2022      Assessment & Plan:    Dysuria Assessment & Plan: Intermittent dysuria for a few days. Urinalysis showed minor amounts of blood but no bacteria. -Culture urine. If bacteria present, start treatment for UTI.  Orders: -     POCT URINALYSIS DIP (CLINITEK) -     Urine Culture  Lumbar back pain Assessment & Plan: Likely musculoskeletal in nature, possibly due to strain from carrying wood. Pain is intermittent, worse in the morning and at night, and improves with movement. No red flag symptoms. -Continue ibuprofen 400mg  every 8 hours for 2 weeks. If pain persists after 1 week, increase to 800mg  for the remaining week. -Use heating pad before bed. -If pain persists, consider back x-ray and physical therapy.   Age-related osteoporosis without current pathological  fracture Assessment & Plan: History of osteoporosis, currently taking calcium supplements. -Continue calcium supplements. -Scheduled for bone density test in December.   Mild mitral regurgitation by prior echocardiogram Assessment & Plan: Identified on recent echocardiogram. No symptoms or impact on cardiac output noted. -No immediate intervention required. Monitor condition.      No orders of the defined types were placed in this encounter.   Orders Placed This Encounter  Procedures   Urine Culture   POCT URINALYSIS DIP (CLINITEK)   General Health Maintenance -Continue Clearlax for constipation as needed. -Report any abnormal stomach pain for further investigation, given family history of pancreatic cancer.  Assessment and Plan      Follow-up: Return if symptoms worsen or fail to improve.   I,Marla I Leal-Borjas,acting as a scribe for [employer], PA.,have documented all relevant documentation on the behalf of Langley Gauss, PA,as directed by  Langley Gauss, PA while in the presence of Langley Gauss, [place].   An After Visit Summary was printed and given to the patient.  Langley Gauss, [place] [facility] [phone]

## 2023-01-19 LAB — URINE CULTURE

## 2023-01-20 DIAGNOSIS — M545 Low back pain, unspecified: Secondary | ICD-10-CM

## 2023-01-20 DIAGNOSIS — I340 Nonrheumatic mitral (valve) insufficiency: Secondary | ICD-10-CM | POA: Insufficient documentation

## 2023-01-20 DIAGNOSIS — R300 Dysuria: Secondary | ICD-10-CM | POA: Insufficient documentation

## 2023-01-20 HISTORY — DX: Low back pain, unspecified: M54.50

## 2023-01-20 HISTORY — DX: Dysuria: R30.0

## 2023-01-20 HISTORY — DX: Nonrheumatic mitral (valve) insufficiency: I34.0

## 2023-01-20 NOTE — Assessment & Plan Note (Signed)
History of osteoporosis, currently taking calcium supplements. -Continue calcium supplements. -Scheduled for bone density test in December.

## 2023-01-20 NOTE — Assessment & Plan Note (Signed)
Intermittent dysuria for a few days. Urinalysis showed minor amounts of blood but no bacteria. -Culture urine. If bacteria present, start treatment for UTI.

## 2023-01-20 NOTE — Assessment & Plan Note (Signed)
Identified on recent echocardiogram. No symptoms or impact on cardiac output noted. -No immediate intervention required. Monitor condition.

## 2023-01-20 NOTE — Assessment & Plan Note (Signed)
Likely musculoskeletal in nature, possibly due to strain from carrying wood. Pain is intermittent, worse in the morning and at night, and improves with movement. No red flag symptoms. -Continue ibuprofen 400mg  every 8 hours for 2 weeks. If pain persists after 1 week, increase to 800mg  for the remaining week. -Use heating pad before bed. -If pain persists, consider back x-ray and physical therapy.

## 2023-01-30 DIAGNOSIS — M810 Age-related osteoporosis without current pathological fracture: Secondary | ICD-10-CM | POA: Diagnosis not present

## 2023-01-30 DIAGNOSIS — N959 Unspecified menopausal and perimenopausal disorder: Secondary | ICD-10-CM | POA: Diagnosis not present

## 2023-01-30 LAB — HM DEXA SCAN

## 2023-03-17 NOTE — Progress Notes (Unsigned)
Subjective:  Patient ID: Dawn Harris, female    DOB: [birth year]  Age: 83 y.o. MRN: [id number]  Chief Complaint  Patient presents with   Medical Management of Chronic Issues    HPI   HYPERTENSION: on bisoprolol-hydrochlorothiazide 2.5-6.25 mg 1/2 pill daily and aspirin 81 mg daily.   Hyperlipidemia: no meds.   Osteoporosis: Walks a lot. Oscal D 500-200 mg once daily.   Constipation: takes miralax daily which works.      12/10/2022    8:02 AM 06/23/2021    8:56 AM 10/05/2020    9:20 AM 10/19/2019    2:23 PM  Depression screen PHQ 2/9  Decreased Interest 0 0 0 0  Down, Depressed, Hopeless 0 0 0 0  PHQ - 2 Score 0 0 0 0        12/07/2022    1:09 PM  Fall Risk   Falls in the past year? 0  Number falls in past yr: 0  Injury with Fall? 0  Risk for fall due to : No Fall Risks  Follow up Falls evaluation completed    Patient Care Team: Blane Ohara, MD as PCP - General (Family Medicine)   Review of Systems  Current Outpatient Medications on File Prior to Visit  Medication Sig Dispense Refill   aspirin EC 81 MG tablet Take 81 mg by mouth daily. Swallow whole.     bisoprolol-hydrochlorothiazide (ZIAC) 2.5-6.25 MG tablet TAKE 1/2 TABLET EVERY DAY 45 tablet 3   calcium-vitamin D (OSCAL WITH D) 500-200 MG-UNIT tablet Take 1 tablet by mouth.     Multiple Vitamins-Minerals (MULTIVITAMIN WITH MINERALS) tablet Take 1 tablet by mouth daily.     Polyethylene Glycol 3350 (MIRALAX PO)      No current facility-administered medications on file prior to visit.   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  No family history on file. Social History   Socioeconomic History   Marital status: Married    Spouse name: Not on file   Number of children: 2   Years of  education: Not on file   Highest education level: 12th grade  Occupational History   Occupation: Retired  Tobacco Use   Smoking status: Never   Smokeless tobacco: Never  Vaping Use   Vaping status: Never Used  Substance and Sexual Activity   Alcohol use: Never   Drug use: Never   Sexual activity: Not Currently    Partners: Male  Other Topics Concern   Not on file  Social History Narrative   Not on file   Social Drivers of Health   Financial Resource Strain: Low Risk  (01/16/2023)   Overall Financial Resource Strain (CARDIA)    Difficulty of Paying Living Expenses: Not very hard  Food Insecurity: No Food Insecurity (01/16/2023)   Hunger Vital Sign    Worried About Running Out of Food in the Last Year: Never true    Ran Out of Food in the Last Year: Never true  Transportation Needs: No Transportation Needs (01/16/2023)   PRAPARE - Administrator, Civil Service (Medical): No    Lack of Transportation (Non-Medical): No  Physical Activity: Insufficiently Active (01/16/2023)   Exercise Vital Sign    Days of Exercise per Week: 4 days    Minutes of Exercise per Session:  30 min  Stress: No Stress Concern Present (01/16/2023)   [employer] of Occupational Health - Occupational Stress Questionnaire    Feeling of Stress : Only a little  Social Connections: Socially Integrated (01/16/2023)   Social Connection and Isolation Panel [NHANES]    Frequency of Communication with Friends and Family: More than three times a week    Frequency of Social Gatherings with Friends and Family: Patient declined    Attends Religious Services: More than 4 times per year    Active Member of Clubs or Organizations: Yes    Attends Banker Meetings: More than 4 times per year    Marital Status: Married    Objective:  LMP  (LMP Unknown)      01/16/2023    2:40 PM 12/10/2022    8:01 AM 07/31/2021    1:44 PM  BP/Weight  Systolic BP 160 120 118  Diastolic BP 70 68 60   Wt. (Lbs) 92 90 94.4  BMI 15.79 kg/m2 15.45 kg/m2 16.72 kg/m2    Physical Exam  Diabetic Foot Exam - Simple   No data filed      Lab Results  Component Value Date   WBC 6.0 12/10/2022   HGB 13.7 12/10/2022   HCT 40.0 12/10/2022   PLT 290 12/10/2022   GLUCOSE 100 (H) 12/10/2022   CHOL 247 (H) 12/10/2022   TRIG 134 12/10/2022   HDL 78 12/10/2022   LDLCALC 146 (H) 12/10/2022   ALT 15 12/10/2022   AST 22 12/10/2022   NA 142 12/10/2022   K 5.4 (H) 12/10/2022   CL 103 12/10/2022   CREATININE 0.90 12/10/2022   BUN 12 12/10/2022   CO2 23 12/10/2022   TSH 3.620 12/10/2022      Assessment & Plan:    There are no diagnoses linked to this encounter.   No orders of the defined types were placed in this encounter.   No orders of the defined types were placed in this encounter.    Follow-up: No follow-ups on file.   I,Marla I Leal-Borjas,acting as a scribe for Blane Ohara, MD.,have documented all relevant documentation on the behalf of Blane Ohara, MD,as directed by  Blane Ohara, MD while in the presence of Blane Ohara, MD.   An After Visit Summary was printed and given to the patient.  Blane Ohara, MD [facility] [phone]

## 2023-03-18 ENCOUNTER — Encounter: Payer: Self-pay | Admitting: Family Medicine

## 2023-03-18 ENCOUNTER — Ambulatory Visit: Payer: Medicare HMO | Admitting: Family Medicine

## 2023-03-18 VITALS — BP 122/68 | HR 59 | Temp 98.5°F | Ht 64.0 in | Wt 94.2 lb

## 2023-03-18 DIAGNOSIS — M810 Age-related osteoporosis without current pathological fracture: Secondary | ICD-10-CM | POA: Diagnosis not present

## 2023-03-18 DIAGNOSIS — I10 Essential (primary) hypertension: Secondary | ICD-10-CM | POA: Diagnosis not present

## 2023-03-18 DIAGNOSIS — E782 Mixed hyperlipidemia: Secondary | ICD-10-CM | POA: Diagnosis not present

## 2023-03-18 MED ORDER — BISOPROLOL-HYDROCHLOROTHIAZIDE 2.5-6.25 MG PO TABS: 0.5000 | ORAL_TABLET | Freq: Every day | ORAL | 0 refills | Status: DC

## 2023-03-19 ENCOUNTER — Encounter: Payer: Self-pay | Admitting: Family Medicine

## 2023-03-19 ENCOUNTER — Other Ambulatory Visit: Payer: Self-pay

## 2023-03-19 DIAGNOSIS — R899 Unspecified abnormal finding in specimens from other organs, systems and tissues: Secondary | ICD-10-CM

## 2023-03-19 LAB — COMPREHENSIVE METABOLIC PANEL
ALT: 15 [IU]/L (ref 0–32)
AST: 20 [IU]/L (ref 0–40)
Albumin: 4.5 g/dL (ref 3.7–4.7)
Alkaline Phosphatase: 95 [IU]/L (ref 44–121)
BUN/Creatinine Ratio: 14 (ref 12–28)
BUN: 15 mg/dL (ref 8–27)
Bilirubin Total: <0.2 mg/dL (ref 0.0–1.2)
CO2: 24 mmol/L (ref 20–29)
Calcium: 9.7 mg/dL (ref 8.7–10.3)
Chloride: 99 mmol/L (ref 96–106)
Creatinine, Ser: 1.1 mg/dL — ABNORMAL HIGH (ref 0.57–1.00)
Globulin, Total: 2.3 g/dL (ref 1.5–4.5)
Glucose: 103 mg/dL — ABNORMAL HIGH (ref 70–99)
Potassium: 5 mmol/L (ref 3.5–5.2)
Sodium: 138 mmol/L (ref 134–144)
Total Protein: 6.8 g/dL (ref 6.0–8.5)
eGFR: 50 mL/min/{1.73_m2} — ABNORMAL LOW (ref >59)

## 2023-03-19 LAB — LIPID PANEL
Chol/HDL Ratio: 3.6 {ratio} (ref 0.0–4.4)
Cholesterol, Total: 238 mg/dL — ABNORMAL HIGH (ref 100–199)
HDL: 66 mg/dL (ref >39)
LDL Chol Calc (NIH): 140 mg/dL — ABNORMAL HIGH (ref 0–99)
Triglycerides: 181 mg/dL — ABNORMAL HIGH (ref 0–149)
VLDL Cholesterol Cal: 32 mg/dL (ref 5–40)

## 2023-03-19 MED ORDER — ATORVASTATIN CALCIUM 10 MG PO TABS: 10.0000 mg | ORAL_TABLET | Freq: Every day | ORAL | 3 refills | Status: DC

## 2023-03-19 NOTE — Progress Notes (Signed)
Order for renal labs already put in.

## 2023-03-20 NOTE — Assessment & Plan Note (Signed)
Well controlled.  No changes to medicines. Continue bisoprolol-hydrochlorothiazide 2.5-6.25 mg 1/2 pill daily and aspirin 81 mg daily. Continue to work on eating a healthy diet and exercise.  Labs drawn today.

## 2023-03-20 NOTE — Assessment & Plan Note (Signed)
History of osteoporosis, currently taking calcium supplements. -Continue calcium supplements.

## 2023-03-20 NOTE — Assessment & Plan Note (Signed)
Heart healthy diet.  Not at goal.  Agreed to start lipitor 10 mg before bed.

## 2023-04-02 DIAGNOSIS — N959 Unspecified menopausal and perimenopausal disorder: Secondary | ICD-10-CM | POA: Diagnosis not present

## 2023-04-02 DIAGNOSIS — Z780 Asymptomatic menopausal state: Secondary | ICD-10-CM | POA: Diagnosis not present

## 2023-04-02 DIAGNOSIS — M810 Age-related osteoporosis without current pathological fracture: Secondary | ICD-10-CM | POA: Diagnosis not present

## 2023-04-03 ENCOUNTER — Encounter: Payer: Self-pay | Admitting: Family Medicine

## 2023-04-09 ENCOUNTER — Other Ambulatory Visit: Payer: HMO

## 2023-04-09 ENCOUNTER — Other Ambulatory Visit: Payer: Self-pay

## 2023-04-09 VITALS — BP 148/88 | HR 71

## 2023-04-09 DIAGNOSIS — R899 Unspecified abnormal finding in specimens from other organs, systems and tissues: Secondary | ICD-10-CM

## 2023-04-09 DIAGNOSIS — I10 Essential (primary) hypertension: Secondary | ICD-10-CM

## 2023-04-09 NOTE — Progress Notes (Signed)
 Patient is in office today for a nurse visit for Blood Pressure Check. Patient blood pressure was 150/92, Patient No chest pain, No shortness of breath, No dyspnea on exertion, No orthopnea, No paroxysmal nocturnal dyspnea, No edema, No palpitations, No syncope  Patient here for BP check BP is 150/92 with pulse of 72, She is taking bisoprolol-hydrochlorothiazide (ZIAC) 2.5-6.25 MG 0.5 tablet once a day, but Sunday and Monday she took a whole tablet due to her blood pressure being so high, she has not took anything this   Second Blood Pressure: 148/88  Per Dr. Sedalia Muta: recommend patient take a whiole pill daily and follow up in 2 weeks with any provider.

## 2023-04-10 ENCOUNTER — Encounter: Payer: Self-pay | Admitting: Family Medicine

## 2023-04-10 LAB — RENAL FUNCTION PANEL
Albumin: 4.6 g/dL (ref 3.7–4.7)
BUN/Creatinine Ratio: 11 — ABNORMAL LOW (ref 12–28)
BUN: 11 mg/dL (ref 8–27)
CO2: 22 mmol/L (ref 20–29)
Calcium: 10.1 mg/dL (ref 8.7–10.3)
Chloride: 96 mmol/L (ref 96–106)
Creatinine, Ser: 0.99 mg/dL (ref 0.57–1.00)
Glucose: 100 mg/dL — ABNORMAL HIGH (ref 70–99)
Phosphorus: 4.1 mg/dL (ref 3.0–4.3)
Potassium: 5.4 mmol/L — ABNORMAL HIGH (ref 3.5–5.2)
Sodium: 137 mmol/L (ref 134–144)
eGFR: 57 mL/min/{1.73_m2} — ABNORMAL LOW (ref >59)

## 2023-04-15 ENCOUNTER — Ambulatory Visit (INDEPENDENT_AMBULATORY_CARE_PROVIDER_SITE_OTHER): Payer: HMO | Admitting: Family Medicine

## 2023-04-15 ENCOUNTER — Ambulatory Visit: Payer: Self-pay | Admitting: Family Medicine

## 2023-04-15 VITALS — BP 120/68 | HR 60 | Temp 98.6°F | Resp 16 | Ht 64.0 in | Wt 91.2 lb

## 2023-04-15 DIAGNOSIS — I10 Essential (primary) hypertension: Secondary | ICD-10-CM | POA: Diagnosis not present

## 2023-04-15 DIAGNOSIS — E875 Hyperkalemia: Secondary | ICD-10-CM

## 2023-04-15 DIAGNOSIS — R079 Chest pain, unspecified: Secondary | ICD-10-CM | POA: Diagnosis not present

## 2023-04-15 HISTORY — DX: Hyperkalemia: E87.5

## 2023-04-15 HISTORY — DX: Chest pain, unspecified: R07.9

## 2023-04-15 MED ORDER — BISOPROLOL-HYDROCHLOROTHIAZIDE 2.5-6.25 MG PO TABS: 1.0000 | ORAL_TABLET | Freq: Every day | ORAL | 0 refills | Status: DC

## 2023-04-15 MED ORDER — ATORVASTATIN CALCIUM 10 MG PO TABS: 10.0000 mg | ORAL_TABLET | Freq: Every day | ORAL | 3 refills | Status: DC

## 2023-04-15 MED ORDER — OLMESARTAN MEDOXOMIL 5 MG PO TABS: 5.0000 mg | ORAL_TABLET | Freq: Every day | ORAL | 0 refills | Status: DC

## 2023-04-15 NOTE — Assessment & Plan Note (Signed)
-   labs drawn, Await labs/testing for assessment and recommendations

## 2023-04-15 NOTE — Assessment & Plan Note (Signed)
 Blood pressure readings have been variable, with some readings in the hypertensive range. Currently on bisoprolol-hydrochlorothiazide 2.5-6.25 mg once daily in the morning. -Change to olmesartan 5 mg daily. -Take bisoprolol-hydrochlorothiazide in the morning and olmesartan in the afternoon. -Continue current blood pressure monitoring and follow-up next week as scheduled.

## 2023-04-15 NOTE — Assessment & Plan Note (Addendum)
 New onset, exertional, non-radiating, and not associated with shortness of breath. EKG showed possible left ventricular hypertrophy. Recent echocardiogram showed good ejection fraction (60-65%). EKG - sinus bradycardia, Ventricular rate 57, PR - 130, QRS - 90, QTc - 414, no ST elevation or depression. - labs drawn today, Await labs/testing for assessment and recommendations - Refer to cardiology (Dr. Drue Flirt) for further evaluation.

## 2023-04-15 NOTE — Progress Notes (Signed)
 Acute Office Visit  Subjective:    Patient ID: Dawn Harris, female    DOB: Oct 25, 1940, 83 y.o.   MRN: [id number]  Chief Complaint  Patient presents with   Hypertension    Discussed the use of AI scribe software for clinical note transcription with the patient, who gave verbal consent to proceed.   HPI: Dawn Harris is an 83 year old female with hypertension who presents with chest pain.  She experiences chest pain primarily upon exertion, such as when she gets up and starts walking. The pain sometimes radiates across her whole chest, including her breasts, and occasionally causes back pain. It is exacerbated by deep breaths. No recent illness, shortness of breath, or chest tightness, but the pain feels like she might have pulled something. She has a history of a heart murmur and underwent an echocardiogram in November, which showed a normal ejection fraction of 60-65%.  She has a history of hypertension with blood pressure readings fluctuating between 169/99 and 115/70. She takes a blood pressure medication once daily in the morning, which she has been on for about 20 years. Despite medication, her blood pressure sometimes remains high. She feels lightheaded and slightly nauseous, which she attributes to not eating as much in an effort to manage her blood pressure. She drinks about four 16-ounce bottles of water daily.  She has been avoiding high-potassium foods due to elevated potassium levels noted in recent lab work. She has not started her cholesterol medication due to a change in her pharmacy provider. Her potassium levels have been slightly high in the past, but she is unsure if any of her medications could be causing this.  She is trying to balance dietary intake to manage her blood pressure and potassium levels.  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 No pertinent surgical history.  History reviewed. No pertinent family history.  Social History   Socioeconomic History   Marital status: Married    Spouse name: Not on file   Number of children: 2   Years of education: Not on file   Highest education level: 12th grade  Occupational History   Occupation: Retired  Tobacco Use   Smoking status: Never   Smokeless tobacco: Never  Vaping Use   Vaping status: Never Used  Substance and Sexual Activity   Alcohol use: Never   Drug use: Never   Sexual activity: Not Currently    Partners: Male  Other Topics Concern   Not on file  Social History Narrative   Not on file   Social Drivers of Health   Financial Resource Strain: Low Risk  (04/15/2023)   Overall Financial Resource Strain (CARDIA)    Difficulty of Paying Living Expenses: Not hard at all  Food Insecurity: No Food Insecurity (04/15/2023)   Hunger Vital Sign    Worried About Running Out of Food in the Last Year: Never true    Ran Out of Food in the Last Year: Never true  Transportation Needs: No Transportation Needs (04/15/2023)   PRAPARE - Administrator, Civil Service (Medical): No    Lack of Transportation (Non-Medical): No  Physical Activity: Insufficiently Active (04/15/2023)   Exercise Vital Sign    Days of Exercise per Week: 2 days    Minutes of Exercise per Session: 20 min  Stress: No Stress Concern Present (01/16/2023)   [employer] of Occupational Health - Occupational Stress Questionnaire    Feeling of Stress : Only a little  Social Connections: Socially Integrated (04/15/2023)   Social Connection and Isolation Panel [NHANES]    Frequency of Communication with Friends and Family: More than three times a week    Frequency of Social Gatherings with Friends and Family: Patient declined    Attends Religious Services: More than 4 times per year    Active Member of [employer]  or Organizations: Yes    Attends Engineer, structural: More than 4 times per year    Marital Status: Married  Catering manager Violence: Not At Risk (12/10/2022)   Humiliation, Afraid, Rape, and Kick questionnaire    Fear of Current or Ex-Partner: No    Emotionally Abused: No    Physically Abused: No    Sexually Abused: No    Outpatient Medications Prior to Visit  Medication Sig Dispense Refill   aspirin EC 81 MG tablet Take 81 mg by mouth daily. Swallow whole.     calcium-vitamin D (OSCAL WITH D) 500-200 MG-UNIT tablet Take 1 tablet by mouth.     Multiple Vitamins-Minerals (MULTIVITAMIN WITH MINERALS) tablet Take 1 tablet by mouth daily.     Polyethylene Glycol 3350 (MIRALAX PO)      bisoprolol-hydrochlorothiazide (ZIAC) 2.5-6.25 MG tablet Take 0.5 tablets by mouth daily. 50 tablet 0   atorvastatin (LIPITOR) 10 MG tablet Take 1 tablet (10 mg total) by mouth daily. 90 tablet 3   No facility-administered medications prior to visit.    Allergies  Allergen Reactions   Penicillins Hives    Review of Systems  Constitutional:  Negative for chills, diaphoresis, fatigue and fever.  HENT:  Negative for congestion, ear pain and sinus pain.   Respiratory:  Negative for cough and shortness of breath.   Cardiovascular:  Positive for chest pain (w exertion).  Gastrointestinal:  Positive for nausea. Negative for abdominal pain, constipation and vomiting.  Genitourinary:  Negative for dysuria.  Musculoskeletal:  Negative for arthralgias.  Neurological:  Positive for light-headedness. Negative for weakness and headaches.  Psychiatric/Behavioral:  Negative for dysphoric mood. The patient is not nervous/anxious.        Objective:        04/15/2023    2:35 PM 04/09/2023    8:51 AM 04/09/2023    8:20 AM  Vitals with BMI  Height 5\' 4"     Weight 91 lbs 3 oz    BMI 15.65    Systolic 120 148 161  Diastolic 68 88 92  Pulse 60  71    Orthostatic VS for the past 72 hrs (Last 3  readings):  Patient Position BP Location Cuff Size  04/15/23 1435 Sitting Right Arm Normal     Physical Exam Constitutional:      General: She is not in acute distress.    Appearance: Normal appearance.  Neck:     Vascular: No carotid bruit.  Cardiovascular:     Rate and Rhythm: Regular rhythm. Bradycardia present.     Heart sounds: Normal heart sounds.  Pulmonary:     Effort: Pulmonary effort is normal.     Breath sounds: Normal breath sounds.  Abdominal:     General: Bowel sounds are normal.     Palpations: Abdomen is soft.     Tenderness: There is no abdominal tenderness.  Neurological:     Mental Status: She is alert. Mental status is at baseline.  Psychiatric:        Mood and Affect: Mood normal.        Behavior: Behavior normal.     Health Maintenance Due  Topic Date Due   Zoster Vaccines- Shingrix (1 of 2) Never done    There are no preventive care reminders to display for this patient.   Lab Results  Component Value Date   TSH 3.620 12/10/2022   Lab Results  Component Value Date   WBC 6.0 12/10/2022   HGB 13.7 12/10/2022   HCT 40.0 12/10/2022   MCV 92 12/10/2022   PLT 290 12/10/2022   Lab Results  Component Value Date   NA 137 04/09/2023   K 5.4 (H) 04/09/2023   CO2 22 04/09/2023   GLUCOSE 100 (H) 04/09/2023   BUN 11 04/09/2023   CREATININE 0.99 04/09/2023   BILITOT <0.2 03/18/2023   ALKPHOS 95 03/18/2023   AST 20 03/18/2023   ALT 15 03/18/2023   PROT 6.8 03/18/2023   ALBUMIN 4.6 04/09/2023   CALCIUM 10.1 04/09/2023   EGFR 57 (L) 04/09/2023   Lab Results  Component Value Date   CHOL 238 (H) 03/18/2023   Lab Results  Component Value Date   HDL 66 03/18/2023   Lab Results  Component Value Date   LDLCALC 140 (H) 03/18/2023   Lab Results  Component Value Date   TRIG 181 (H) 03/18/2023   Lab Results  Component Value Date   CHOLHDL 3.6 03/18/2023   No results found for: "HGBA1C"     Assessment & Plan:  Chest pain on  exertion Assessment & Plan: New onset, exertional, non-radiating, and not associated with shortness of breath. EKG showed possible left ventricular hypertrophy. Recent echocardiogram showed good ejection fraction (60-65%). EKG - sinus bradycardia, Ventricular rate 57, PR - 130, QRS - 90, QTc - 414, no ST elevation or depression. - labs drawn today, Await labs/testing for assessment and recommendations - Refer to cardiology (Dr. Drue Flirt) for further evaluation.   Orders: -     Ambulatory referral to Cardiology -     Pro b natriuretic peptide (BNP) -     EKG 12-Lead  Essential hypertension Assessment & Plan: Blood pressure readings have been variable, with some readings in the hypertensive range. Currently on bisoprolol-hydrochlorothiazide 2.5-6.25 mg once daily in the morning. -Change to olmesartan 5 mg daily. -Take bisoprolol-hydrochlorothiazide in the morning and olmesartan in the afternoon. -Continue current blood pressure monitoring and follow-up next week as scheduled.  Orders: -     Bisoprolol-hydroCHLOROthiazide; Take 1 tablet by mouth daily.  Dispense: 50 tablet; Refill: 0 -     Olmesartan Medoxomil; Take 1 tablet (5 mg total) by mouth daily.  Dispense: 30 tablet; Refill: 0  Serum potassium elevated Assessment & Plan: - labs drawn, Await labs/testing for assessment and recommendations   Orders: -     Comprehensive metabolic panel  Other orders -     Atorvastatin Calcium; Take 1 tablet (10 mg total) by mouth daily.  Dispense: 90 tablet; Refill: 3     Meds ordered this encounter  Medications   atorvastatin (LIPITOR) 10 MG tablet    Sig: Take 1 tablet (10 mg total) by mouth daily.    Dispense:  90 tablet    Refill:  3   bisoprolol-hydrochlorothiazide (ZIAC) 2.5-6.25 MG tablet    Sig: Take 1 tablet by mouth daily.    Dispense:  50 tablet    Refill:  0   olmesartan (BENICAR) 5 MG tablet  Sig: Take 1 tablet (5 mg total) by mouth daily.    Dispense:  30 tablet     Refill:  0    Orders Placed This Encounter  Procedures   Pro b natriuretic peptide   Comprehensive metabolic panel   Ambulatory referral to Cardiology   EKG 12-Lead     Follow-up: Return in about 11 days (around 04/26/2023) for BP recheck.  An After Visit Summary was printed and given to the patient.  Total time spent on today's visit was 33 minutes, including both face-to-face time and nonface-to-face time personally spent on review of chart (labs and imaging), discussing labs and goals, discussing further work-up, treatment options, referrals to specialist if needed, reviewing outside records if pertinent, answering patient's questions, and coordinating care.    Lajuana Matte, FNP [facility] [phone]

## 2023-04-15 NOTE — Telephone Encounter (Signed)
 Summary: bp issues   Copied From CRM [id number]. Reason for Triage: pt seen last week for bp issues.she states her bp is still running high. At 6:30 am this morning it was 127/83.  After breakfast today, and her bp was 143/91.  No symptoms. She would like to know what to do  Chief Complaint: Elevated BP Symptoms: Jittery, lightheaded, sensation in chest Frequency: 1-2 weeks Pertinent Negatives: Patient denies difficulty breathing Disposition: [] ED /[] Urgent Care (no appt availability in office) / [x] Appointment(In office/virtual)/ []  [facility]/ [] Home Care/ [] Refused Recommended Disposition /[] [facility]/ []  Follow-up with PCP Additional Notes: Patient called in to report elevated BP. Patient was seen in the office on 04/09/23 and instructed to follow-up in 2 weeks with any provider, per note in patient's chart. Patient has been taking the increased dose of her BP medication as advised and is still experiencing higher than normal BP reading. Patient was noticeably anxious while on the phone with this RN. Patient's most recent BP reading was 143/91, taken about 2 hours before this call. Patient stated she is feeling lightheaded off and on, jittery and also feels a sensation in her chest. Patient stated she believes some of her symptoms are due to her nerves. Patient denied difficulty breathing and sudden vision changes. No availability with PCP. Scheduled patient for this afternoon with an alternate provider in the office. This RN advised patient to call back if symptoms worsen. Patient complied.    Reason for Disposition  [1] Systolic BP  >= 130 OR Diastolic >= 80 AND [2] taking BP medications  Answer Assessment - Initial Assessment Questions 1. BLOOD PRESSURE: "What is the blood pressure?" "Did you take at least two measurements 5 minutes apart?"     127/83 when she woke up today, took BP medication, took BP again and measured 143/91 2. ONSET: "When did you take your blood  pressure?"     States BP has been running high for a couple of weeks,  3. HOW: "How did you take your blood pressure?" (e.g., automatic home BP monitor, visiting nurse)     Automatic BP cuff at home 4. HISTORY: "Do you have a history of high blood pressure?"     Yes  5. MEDICINES: "Are you taking any medicines for blood pressure?" "Have you missed any doses recently?"     Bisoprolol-hydrochlorothiazide, upped dose after visit on 04/09/23, states she has not missed any doses within past 2 weeks 6. OTHER SYMPTOMS: "Do you have any symptoms?" (e.g., blurred vision, chest pain, difficulty breathing, headache, weakness)     States she feels jittery, but this is most likely due to nerves, lightheaded off and on, states she feels a sensation in her chest, but this is also most likely due to nerves, denies difficulty breathing, denies sudden vision changes  Protocols used: Blood Pressure - High-A-AH

## 2023-04-16 ENCOUNTER — Other Ambulatory Visit: Payer: Self-pay

## 2023-04-16 LAB — COMPREHENSIVE METABOLIC PANEL
ALT: 13 IU/L (ref 0–32)
AST: 22 IU/L (ref 0–40)
Albumin: 4.7 g/dL (ref 3.7–4.7)
Alkaline Phosphatase: 73 IU/L (ref 44–121)
BUN/Creatinine Ratio: 14 (ref 12–28)
BUN: 14 mg/dL (ref 8–27)
Bilirubin Total: 0.3 mg/dL (ref 0.0–1.2)
CO2: 25 mmol/L (ref 20–29)
Calcium: 9.7 mg/dL (ref 8.7–10.3)
Chloride: 96 mmol/L (ref 96–106)
Creatinine, Ser: 0.99 mg/dL (ref 0.57–1.00)
Globulin, Total: 2.5 g/dL (ref 1.5–4.5)
Glucose: 86 mg/dL (ref 70–99)
Potassium: 4.5 mmol/L (ref 3.5–5.2)
Sodium: 135 mmol/L (ref 134–144)
Total Protein: 7.2 g/dL (ref 6.0–8.5)
eGFR: 57 mL/min/{1.73_m2} — ABNORMAL LOW (ref >59)

## 2023-04-16 LAB — PRO B NATRIURETIC PEPTIDE: NT-Pro BNP: 379 pg/mL (ref 0–738)

## 2023-04-17 ENCOUNTER — Encounter: Payer: Self-pay | Admitting: Cardiology

## 2023-04-17 ENCOUNTER — Telehealth (HOSPITAL_COMMUNITY): Payer: Self-pay | Admitting: *Deleted

## 2023-04-17 ENCOUNTER — Ambulatory Visit: Payer: HMO | Attending: Cardiology | Admitting: Cardiology

## 2023-04-17 VITALS — BP 140/72 | HR 64 | Ht 64.0 in | Wt 92.6 lb

## 2023-04-17 DIAGNOSIS — I10 Essential (primary) hypertension: Secondary | ICD-10-CM | POA: Diagnosis not present

## 2023-04-17 DIAGNOSIS — R079 Chest pain, unspecified: Secondary | ICD-10-CM | POA: Diagnosis not present

## 2023-04-17 DIAGNOSIS — E782 Mixed hyperlipidemia: Secondary | ICD-10-CM | POA: Diagnosis not present

## 2023-04-17 DIAGNOSIS — R0789 Other chest pain: Secondary | ICD-10-CM | POA: Diagnosis not present

## 2023-04-17 DIAGNOSIS — I700 Atherosclerosis of aorta: Secondary | ICD-10-CM

## 2023-04-17 HISTORY — DX: Atherosclerosis of aorta: I70.0

## 2023-04-17 MED ORDER — NITROGLYCERIN 0.4 MG SL SUBL
0.4000 mg | SUBLINGUAL_TABLET | SUBLINGUAL | 6 refills | Status: DC | PRN
Start: 2023-04-17 — End: 2023-07-31

## 2023-04-17 NOTE — Telephone Encounter (Signed)
 Patient given detailed instructions per Myocardial Perfusion Study Information Sheet for the test on 04/24/2023 at 7:45. Patient notified to arrive 15 minutes early and that it is imperative to arrive on time for appointment to keep from having the test rescheduled.  If you need to cancel or reschedule your appointment, please call the office within 24 hours of your appointment. . Patient verbalized understanding.Daneil Dolin

## 2023-04-17 NOTE — Progress Notes (Signed)
 Cardiology Office Note:    Date:  04/17/2023   ID:  Dawn Harris, DOB [birth year], MRN [id number]  PCP:  Blane Ohara, MD  Cardiologist:  Garwin Brothers, MD   Referring MD: Renne Crigler, FNP    ASSESSMENT:    1. Mixed hyperlipidemia   2. Essential hypertension   3. Atypical chest pain   4. Aortic atherosclerosis (HCC)    PLAN:    In order of problems listed above:  Chest pain and aortic atherosclerosis: Patient has aortic atherosclerosis documented by CT scan of the abdomen in the past.  Chest pain atypical however in view of risk factors she will do an exercise stress Cardiolite.  She is agreeable and I discussed this with her.  Her daughter accompanies her for this visit.  Sublingual nitroglycerin prescription was sent, its protocol and 911 protocol explained and the patient vocalized understanding questions were answered to the patient's satisfaction Mixed dyslipidemia: She has been initiated statins and I told her that it is guideline directed therapy.  She is agreeable she has started.  Diet emphasized.  Follow-up blood work will be by primary care.  Goal LDL less than 70. Essential hypertension: Blood pressure stable and diet was emphasized.  She mentions to me that she has an element of whitecoat hypertension.  Also her doctor mentioned to her about osteoporosis medications and she was nervous about it but now she appears reassured after I discussed this with her.  She will discuss all these issues relating to osteoporosis with her primary care. Patient will be seen in follow-up appointment in 6 months or earlier if the patient has any concerns.    Medication Adjustments/Labs and Tests Ordered: Current medicines are reviewed at length with the patient today.  Concerns regarding medicines are outlined above.  Orders Placed This Encounter  Procedures   EKG 12-Lead   No orders of the defined types were placed in this encounter.    History of Present Illness:    Dawn Harris is a 83 y.o. female who is being seen today for the evaluation of chest pain at the request of Renne Crigler, FNP.  Patient is a pleasant 83 year old female.  She has past medical history of essential hypertension, mixed dyslipidemia.  She has been evaluated for chest pain today.  She mentions to me that she has some pressure-like sensation in the chest.  This may or may not occur with exertion.  No radiation to the neck or to the arm.  She is overall active lady.  Does not exercise on a regular basis.  She ambulates age appropriately.  Her daughter accompanies her for this visit.  At the time of my evaluation, the patient is alert awake oriented and in no distress.  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   Chest pain on exertion 04/15/2023   Diverticulosis 10/19/2019   Diverticulosis 10/19/2019   Dizziness 09/21/2020   Dysuria 01/20/2023   Encounter for Medicare annual wellness exam 12/10/2022   Encounter for osteoporosis screening in asymptomatic postmenopausal patient 12/10/2022   Essential hypertension 10/19/2019   GERD (gastroesophageal reflux disease) 10/19/2019   Internal hemorrhoid 10/19/2019   Lumbar back pain 01/20/2023   Mild mitral regurgitation by prior echocardiogram 01/20/2023   Mixed hyperlipidemia 12/10/2022   Murmur 12/10/2022   Serum potassium elevated 04/15/2023    Past Surgical History:  Procedure Laterality Date   CATARACT EXTRACTION      Current Medications: Current Meds  Medication  Sig   aspirin EC 81 MG tablet Take 81 mg by mouth daily. Swallow whole.   bisoprolol-hydrochlorothiazide (ZIAC) 2.5-6.25 MG tablet Take 1 tablet by mouth daily.   calcium-vitamin D (OSCAL WITH D) 500-200 MG-UNIT tablet Take 1 tablet by mouth daily.   Multiple Vitamins-Minerals (MULTIVITAMIN WITH MINERALS) tablet Take 1 tablet by mouth daily.   olmesartan (BENICAR) 5 MG tablet Take 1 tablet (5 mg total) by mouth daily.     Allergies:    Penicillins   Social History   Socioeconomic History   Marital status: Married    Spouse name: Not on file   Number of children: 2   Years of education: Not on file   Highest education level: 12th grade  Occupational History   Occupation: Retired  Tobacco Use   Smoking status: Never   Smokeless tobacco: Never  Vaping Use   Vaping status: Never Used  Substance and Sexual Activity   Alcohol use: Never   Drug use: Never   Sexual activity: Not Currently    Partners: Male  Other Topics Concern   Not on file  Social History Narrative   Not on file   Social Drivers of Health   Financial Resource Strain: Low Risk  (04/15/2023)   Overall Financial Resource Strain (CARDIA)    Difficulty of Paying Living Expenses: Not hard at all  Food Insecurity: No Food Insecurity (04/15/2023)   Hunger Vital Sign    Worried About Running Out of Food in the Last Year: Never true    Ran Out of Food in the Last Year: Never true  Transportation Needs: No Transportation Needs (04/15/2023)   PRAPARE - Administrator, Civil Service (Medical): No    Lack of Transportation (Non-Medical): No  Physical Activity: Insufficiently Active (04/15/2023)   Exercise Vital Sign    Days of Exercise per Week: 2 days    Minutes of Exercise per Session: 20 min  Stress: No Stress Concern Present (01/16/2023)   [employer] of Occupational Health - Occupational Stress Questionnaire    Feeling of Stress : Only a little  Social Connections: Socially Integrated (04/15/2023)   Social Connection and Isolation Panel [NHANES]    Frequency of Communication with Friends and Family: More than three times a week    Frequency of Social Gatherings with Friends and Family: Patient declined    Attends Religious Services: More than 4 times per year    Active Member of [employer] or Organizations: Yes    Attends Engineer, structural: More than 4 times per year    Marital Status: Married     Family History: The  patient's family history includes Cancer in her father; Heart disease in her father; Hypertension in her mother. There is no history of Diabetes.  ROS:   Please see the history of present illness.    All other systems reviewed and are negative.  EKGs/Labs/Other Studies Reviewed:    The following studies were reviewed today:  .Marland KitchenEKG Interpretation Date/Time:  Wednesday April 17 2023 08:46:15 EST Ventricular Rate:  64 PR Interval:  146 QRS Duration:  82 QT Interval:  398 QTC Calculation: 410 R Axis:   70  Text Interpretation: Normal sinus rhythm Minimal voltage criteria for LVH, may be normal variant Borderline ECG No previous ECGs available Confirmed by Belva Crome [phone]) on 04/17/2023 9:18:34 AM       Recent Labs: 12/10/2022: Hemoglobin 13.7; Platelets 290; TSH 3.620 04/15/2023: ALT 13; BUN 14; Creatinine, Ser 0.99; NT-Pro BNP  379; Potassium 4.5; Sodium 135  Recent Lipid Panel    Component Value Date/Time   CHOL 238 (H) 03/18/2023 1434   TRIG 181 (H) 03/18/2023 1434   HDL 66 03/18/2023 1434   CHOLHDL 3.6 03/18/2023 1434   LDLCALC 140 (H) 03/18/2023 1434    Physical Exam:    VS:  BP (!) 140/72   Pulse 64   Ht 5\' 4"  (1.626 m)   Wt 92 lb 9.6 oz (42 kg)   LMP  (LMP Unknown)   SpO2 97%   BMI 15.89 kg/m     Wt Readings from Last 3 Encounters:  04/17/23 92 lb 9.6 oz (42 kg)  04/15/23 91 lb 3.2 oz (41.4 kg)  03/18/23 94 lb 3.2 oz (42.7 kg)     GEN: Patient is in no acute distress HEENT: Normal NECK: No JVD; No carotid bruits LYMPHATICS: No lymphadenopathy CARDIAC: S1 S2 regular, 2/6 systolic murmur at the apex. RESPIRATORY:  Clear to auscultation without rales, wheezing or rhonchi  ABDOMEN: Soft, non-tender, non-distended MUSCULOSKELETAL:  No edema; No deformity  SKIN: Warm and dry NEUROLOGIC:  Alert and oriented x 3 PSYCHIATRIC:  Normal affect    Signed, Garwin Brothers, MD  04/17/2023 9:17 AM    [facility]

## 2023-04-17 NOTE — Patient Instructions (Signed)
 Medication Instructions:  Your physician has recommended you make the following change in your medication:   Use nitroglycerin 1 tablet placed under the tongue at the first sign of chest pain or an angina attack. 1 tablet may be used every 5 minutes as needed, for up to 15 minutes. Do not take more than 3 tablets in 15 minutes. If pain persist call 911 or go to the nearest ED.   *If you need a refill on your cardiac medications before your next appointment, please call your pharmacy*   Lab Work: None ordered If you have labs (blood work) drawn today and your tests are completely normal, you will receive your results only by: MyChart Message (if you have MyChart) OR A paper copy in the mail If you have any lab test that is abnormal or we need to change your treatment, we will call you to review the results.   Testing/Procedures: You are scheduled for a Myocardial Perfusion Imaging Study.  Please arrive 15 minutes prior to your appointment time for registration and insurance purposes.  The test will take approximately 3 to 4 hours to complete; you may bring reading material.  If someone comes with you to your appointment, they will need to remain in the main lobby due to limited space in the testing area.   How to prepare for your Myocardial Perfusion Test: Do not eat or drink 3 hours prior to your test, except you may have water. Do not consume products containing caffeine (regular or decaffeinated) 12 hours prior to your test. (ex: coffee, chocolate, sodas, tea). Do bring a list of your current medications with you.  If not listed below, you may take your medications as normal. Do not take Bisoprolol-hydrochlorothiazide for 24 hours prior to the test.  Bring the medication to your appointment as you may be required to take it once the test is complete. Do wear comfortable clothes (no dresses or overalls) and walking shoes, tennis shoes preferred (No heels or open toe shoes are  allowed). Do NOT wear cologne, perfume, aftershave, or lotions (deodorant is allowed). If these instructions are not followed, your test will have to be rescheduled.  If you cannot keep your appointment, please provide 24 hours notification to the Nuclear Lab, to avoid a possible $50 charge to your account.  Follow-Up: At [facility], you and your health needs are our priority.  As part of our continuing mission to provide you with exceptional heart care, we have created designated Provider Care Teams.  These Care Teams include your primary Cardiologist (physician) and Advanced Practice Providers (APPs -  Physician Assistants and Nurse Practitioners) who all work together to provide you with the care you need, when you need it.  We recommend signing up for the patient portal called "MyChart".  Sign up information is provided on this After Visit Summary.  MyChart is used to connect with patients for Virtual Visits (Telemedicine).  Patients are able to view lab/test results, encounter notes, upcoming appointments, etc.  Non-urgent messages can be sent to your provider as well.   To learn more about what you can do with MyChart, go to [email or web address].    Your next appointment:   9 month(s)  Provider:   Belva Crome, MD   Other Instructions  Cardiac Nuclear Scan A cardiac nuclear scan is a test that is done to check the flow of blood to your heart. It is done when you are resting and when you are exercising. The test looks  for problems such as: Not enough blood reaching a portion of the heart. The heart muscle not working as it should. You may need this test if you have: Heart disease. Lab results that are not normal. Had heart surgery or a balloon procedure to open up blocked arteries (angioplasty) or a small mesh tube (stent). Chest pain. Shortness of breath. Had a heart attack. In this test, a special dye (tracer) is put into your bloodstream. The tracer will travel  to your heart. A camera will then take pictures of your heart to see how the tracer moves through your heart. This test is usually done at a hospital and takes 2-4 hours. Tell a doctor about: Any allergies you have. All medicines you are taking, including vitamins, herbs, eye drops, creams, and over-the-counter medicines. Any bleeding problems you have. Any surgeries you have had. Any medical conditions you have. Whether you are pregnant or may be pregnant. Any history of asthma or long-term (chronic) lung disease. Any history of heart rhythm disorders or heart valve conditions. What are the risks? Your doctor will talk with you about risks. These may include: Serious chest pain and heart attack. This is only a risk if the stress portion of the test is done. Fast or uneven heartbeats (palpitations). A feeling of warmth in your chest. This feeling usually does not last long. Allergic reaction to the tracer. Shortness of breath or trouble breathing. What happens before the test? Ask your doctor about changing or stopping your normal medicines. Follow instructions from your doctor about what you cannot eat or drink. Remove your jewelry on the day of the test. Ask your doctor if you need to avoid nicotine or caffeine. What happens during the test? An IV tube will be inserted into one of your veins. Your doctor will give you a small amount of tracer through the IV tube. You will wait for 20-40 minutes while the tracer moves through your bloodstream. Your heart will be monitored with an electrocardiogram (ECG). You will lie down on an exam table. Pictures of your heart will be taken for about 15-20 minutes. You may also have a stress test. For this test, one of these things may be done: You will be asked to exercise on a treadmill or a stationary bike. You will be given medicines that will make your heart work harder. This is done if you are unable to exercise. When blood flow to your heart  has peaked, a tracer will again be given through the IV tube. After 20-40 minutes, you will get back on the exam table. More pictures will be taken of your heart. Depending on the tracer that is used, more pictures may need to be taken 3-4 hours later. Your IV tube will be removed when the test is over. The test may vary among doctors and hospitals. What happens after the test? Ask your doctor: Whether you can return to your normal schedule, including diet, activities, travel, and medicines. Whether you should drink more fluids. This will help to remove the tracer from your body. Ask your doctor, or the department that is doing the test: When will my results be ready? How will I get my results? What are my treatment options? What other tests do I need? What are my next steps? This information is not intended to replace advice given to you by your health care provider. Make sure you discuss any questions you have with your health care provider. Document Revised: 07/04/2021 Document Reviewed: 07/04/2021 Elsevier  Patient Education  2023 [employer].

## 2023-04-19 DIAGNOSIS — I10 Essential (primary) hypertension: Secondary | ICD-10-CM | POA: Diagnosis not present

## 2023-04-19 DIAGNOSIS — K5900 Constipation, unspecified: Secondary | ICD-10-CM | POA: Diagnosis not present

## 2023-04-19 DIAGNOSIS — E559 Vitamin D deficiency, unspecified: Secondary | ICD-10-CM | POA: Diagnosis not present

## 2023-04-19 DIAGNOSIS — E785 Hyperlipidemia, unspecified: Secondary | ICD-10-CM | POA: Diagnosis not present

## 2023-04-19 DIAGNOSIS — H04123 Dry eye syndrome of bilateral lacrimal glands: Secondary | ICD-10-CM | POA: Diagnosis not present

## 2023-04-19 DIAGNOSIS — I209 Angina pectoris, unspecified: Secondary | ICD-10-CM | POA: Diagnosis not present

## 2023-04-19 DIAGNOSIS — M810 Age-related osteoporosis without current pathological fracture: Secondary | ICD-10-CM | POA: Diagnosis not present

## 2023-04-19 DIAGNOSIS — Z7982 Long term (current) use of aspirin: Secondary | ICD-10-CM | POA: Diagnosis not present

## 2023-04-23 ENCOUNTER — Encounter: Payer: Self-pay | Admitting: Family Medicine

## 2023-04-23 ENCOUNTER — Telehealth: Payer: Self-pay | Admitting: Cardiology

## 2023-04-23 ENCOUNTER — Ambulatory Visit (INDEPENDENT_AMBULATORY_CARE_PROVIDER_SITE_OTHER): Payer: HMO | Admitting: Family Medicine

## 2023-04-23 VITALS — BP 122/64 | HR 60 | Temp 97.7°F | Resp 16 | Ht 64.0 in | Wt 92.6 lb

## 2023-04-23 DIAGNOSIS — R079 Chest pain, unspecified: Secondary | ICD-10-CM

## 2023-04-23 DIAGNOSIS — I10 Essential (primary) hypertension: Secondary | ICD-10-CM | POA: Diagnosis not present

## 2023-04-23 NOTE — Assessment & Plan Note (Signed)
 Chronic Improved control with current regimen. Occasional lightheadedness reported, possibly related to medication. BP Readings from Last 3 Encounters:  04/23/23 122/64  04/17/23 (!) 140/72  04/15/23 120/68    -Continue current antihypertensive regimen. -Continue bisoprolol-hydrochlorothiazide 2.5-6.25 mg once in the morning and olmesartan 5 mg in the afternoon.  -Consider adjusting medication timing if lightheadedness persists.

## 2023-04-23 NOTE — Progress Notes (Signed)
 Subjective:  Patient ID: Dawn Harris, female    DOB: [birth year]  Age: 83 y.o. MRN: [id number]  Chief Complaint  Patient presents with   Hypertension    Follow up    Discussed the use of AI scribe software for clinical note transcription with the patient, who gave verbal consent to proceed.  History of Present Illness   Dawn Harris is an 83 year old female with hypertension who presents for follow-up on blood pressure management and upcoming stress test.  Her blood pressure has improved since April 19, 2023, with readings now around 103/68 to 140/85 mmHg. She is adherent to her prescribed blood pressure medication and uses a device that logs her readings on her phone. She usually takes olmesartan in the evening around 5 or 6 PM.  She experiences occasional lightheadedness and weakness, particularly in the late afternoon or early evening. No significant chest pain recently, only mild and occasional discomfort. She recalls a past incident where carrying heavy loads of wood caused back pain, which she initially thought was related to her chest discomfort.  She is scheduled for a stress test tomorrow. Her past medical history includes a heart murmur diagnosed in 2010, for which she underwent a similar workup at that time. A recent CT scan showed something in her stomach, which she associates with potential plaque buildup. Her family history includes heart problems in her father, who had a valve replacement and bypass surgery at 19, followed by a diagnosis of pancreatic cancer.          03/18/2023    1:41 PM 12/10/2022    8:02 AM 06/23/2021    8:56 AM 10/05/2020    9:20 AM 10/19/2019    2:23 PM  Depression screen PHQ 2/9  Decreased Interest 0 0 0 0 0  Down, Depressed, Hopeless 0 0 0 0 0  PHQ - 2 Score 0 0 0 0 0        12/07/2022    1:09 PM  Fall Risk   Falls in the past year? 0  Number falls in past yr: 0  Injury with Fall? 0  Risk for fall due to : No Fall Risks  Follow up  Falls evaluation completed    Patient Care Team: Blane Ohara, MD as PCP - General (Family Medicine)   Review of Systems  Constitutional:  Negative for chills, diaphoresis, fatigue and fever.  HENT:  Negative for congestion, ear pain and sinus pain.   Respiratory:  Negative for cough and shortness of breath.   Cardiovascular:  Positive for chest pain ("slight twinge evey once in awhile").  Gastrointestinal:  Negative for abdominal pain, constipation, nausea and vomiting.  Genitourinary:  Negative for dysuria.  Musculoskeletal:  Negative for arthralgias.  Neurological:  Positive for light-headedness (occasional). Negative for weakness and headaches.  Psychiatric/Behavioral:  Negative for dysphoric mood. The patient is not nervous/anxious.     Current Outpatient Medications on File Prior to Visit  Medication Sig Dispense Refill   atorvastatin (LIPITOR) 10 MG tablet Take 1 tablet (10 mg total) by mouth daily. 90 tablet 3   bisoprolol-hydrochlorothiazide (ZIAC) 2.5-6.25 MG tablet Take 1 tablet by mouth daily. 50 tablet 0   calcium-vitamin D (OSCAL WITH D) 500-200 MG-UNIT tablet Take 1 tablet by mouth daily.     Multiple Vitamins-Minerals (MULTIVITAMIN WITH MINERALS) tablet Take 1 tablet by mouth daily.     nitroGLYCERIN (NITROSTAT) 0.4 MG SL tablet Place 1 tablet (0.4 mg total) under the tongue every 5 (five)  minutes as needed. 25 tablet 6   olmesartan (BENICAR) 5 MG tablet Take 1 tablet (5 mg total) by mouth daily. 30 tablet 0   aspirin EC 81 MG tablet Take 81 mg by mouth daily. Swallow whole. (Patient not taking: Reported on 04/23/2023)     No current facility-administered medications on file prior to visit.   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   Chest pain on exertion 04/15/2023   Diverticulosis 10/19/2019   Diverticulosis 10/19/2019   Dizziness 09/21/2020   Dysuria 01/20/2023   Encounter for Medicare annual wellness exam 12/10/2022    Encounter for osteoporosis screening in asymptomatic postmenopausal patient 12/10/2022   Essential hypertension 10/19/2019   GERD (gastroesophageal reflux disease) 10/19/2019   Internal hemorrhoid 10/19/2019   Lumbar back pain 01/20/2023   Mild mitral regurgitation by prior echocardiogram 01/20/2023   Mixed hyperlipidemia 12/10/2022   Murmur 12/10/2022   Serum potassium elevated 04/15/2023   Past Surgical History:  Procedure Laterality Date   CATARACT EXTRACTION      Family History  Problem Relation Age of Onset   Hypertension Mother    Heart disease Father    Cancer Father    Diabetes Neg Hx    Social History   Socioeconomic History   Marital status: Married    Spouse name: Not on file   Number of children: 2   Years of education: Not on file   Highest education level: 12th grade  Occupational History   Occupation: Retired  Tobacco Use   Smoking status: Never   Smokeless tobacco: Never  Vaping Use   Vaping status: Never Used  Substance and Sexual Activity   Alcohol use: Never   Drug use: Never   Sexual activity: Not Currently    Partners: Male  Other Topics Concern   Not on file  Social History Narrative   Not on file   Social Drivers of Health   Financial Resource Strain: Low Risk  (04/15/2023)   Overall Financial Resource Strain (CARDIA)    Difficulty of Paying Living Expenses: Not hard at all  Food Insecurity: No Food Insecurity (04/15/2023)   Hunger Vital Sign    Worried About Running Out of Food in the Last Year: Never true    Ran Out of Food in the Last Year: Never true  Transportation Needs: No Transportation Needs (04/15/2023)   PRAPARE - Administrator, Civil Service (Medical): No    Lack of Transportation (Non-Medical): No  Physical Activity: Insufficiently Active (04/15/2023)   Exercise Vital Sign    Days of Exercise per Week: 2 days    Minutes of Exercise per Session: 20 min  Stress: No Stress Concern Present (01/16/2023)   [employer] of Occupational Health - Occupational Stress Questionnaire    Feeling of Stress : Only a little  Social Connections: Socially Integrated (04/15/2023)   Social Connection and Isolation Panel [NHANES]    Frequency of Communication with Friends and Family: More than three times a week    Frequency of Social Gatherings with Friends and Family: Patient declined    Attends Religious Services: More than 4 times per year    Active Member of Clubs or Organizations: Yes    Attends Engineer, structural: More than 4 times per year    Marital Status: Married    Objective:  BP 122/64   Pulse 60   Temp 97.7 F (36.5 C) (Temporal)   Resp 16   Ht  5\' 4"  (1.626 m)   Wt 92 lb 9.6 oz (42 kg)   LMP  (LMP Unknown)   SpO2 99%   BMI 15.89 kg/m      04/23/2023   11:20 AM 04/17/2023    8:43 AM 04/15/2023    2:35 PM  BP/Weight  Systolic BP 122 140 120  Diastolic BP 64 72 68  Wt. (Lbs) 92.6 92.6 91.2  BMI 15.89 kg/m2 15.89 kg/m2 15.65 kg/m2    Physical Exam Vitals reviewed.  Constitutional:      General: She is not in acute distress.    Appearance: Normal appearance. She is not ill-appearing.  Eyes:     Conjunctiva/sclera: Conjunctivae normal.  Cardiovascular:     Rate and Rhythm: Normal rate and regular rhythm.     Heart sounds: Murmur heard.  Pulmonary:     Effort: Pulmonary effort is normal.     Breath sounds: Normal breath sounds. No wheezing.  Musculoskeletal:        General: Normal range of motion.  Skin:    General: Skin is warm.  Neurological:     Mental Status: She is alert. Mental status is at baseline.  Psychiatric:        Mood and Affect: Mood normal.        Behavior: Behavior normal.    Lab Results  Component Value Date   WBC 6.0 12/10/2022   HGB 13.7 12/10/2022   HCT 40.0 12/10/2022   PLT 290 12/10/2022   GLUCOSE 86 04/15/2023   CHOL 238 (H) 03/18/2023   TRIG 181 (H) 03/18/2023   HDL 66 03/18/2023   LDLCALC 140 (H) 03/18/2023   ALT 13 04/15/2023    AST 22 04/15/2023   NA 135 04/15/2023   K 4.5 04/15/2023   CL 96 04/15/2023   CREATININE 0.99 04/15/2023   BUN 14 04/15/2023   CO2 25 04/15/2023   TSH 3.620 12/10/2022      Assessment & Plan:  Essential hypertension Assessment & Plan: Chronic Improved control with current regimen. Occasional lightheadedness reported, possibly related to medication. BP Readings from Last 3 Encounters:  04/23/23 122/64  04/17/23 (!) 140/72  04/15/23 120/68    -Continue current antihypertensive regimen. -Continue bisoprolol-hydrochlorothiazide 2.5-6.25 mg once in the morning and olmesartan 5 mg in the afternoon.  -Consider adjusting medication timing if lightheadedness persists.   Chest pain on exertion Assessment & Plan: Currently managed by Dr. Tomie China Scheduled for stress test due to history of chest pain and family history of heart disease. No current chest pain reported. -Complete stress test tomorrow as scheduled. -Follow up with results and adjust treatment plan as necessary.     No orders of the defined types were placed in this encounter.   No orders of the defined types were placed in this encounter.    Follow-up: Return for appointment with Dr. Sedalia Muta in July.  An After Visit Summary was printed and given to the patient.  Total time spent on today's visit was 23 minutes, including both face-to-face time and nonface-to-face time personally spent on review of chart (labs and imaging), discussing labs and goals, discussing further work-up, treatment options, referrals to specialist if needed, reviewing outside records if pertinent, answering patient's questions, and coordinating care.    Lajuana Matte, FNP [facility] [phone]

## 2023-04-23 NOTE — Assessment & Plan Note (Signed)
 Currently managed by Dr. Tomie China Scheduled for stress test due to history of chest pain and family history of heart disease. No current chest pain reported. -Complete stress test tomorrow as scheduled. -Follow up with results and adjust treatment plan as necessary.

## 2023-04-23 NOTE — Telephone Encounter (Signed)
 Pt is scheduled to have a MYOCARDIAL PERFUSION done tomorrow but can't remember which medications she should or shouldn't take so she'd like a callback from the nurse for clarity. Please advise

## 2023-04-23 NOTE — Telephone Encounter (Signed)
 Pt called to clarify that she was supposed to hole the Bisoprolol-hydrochlorothiazide 24 hours prior to Stress test tomorrow. Pt verbalized understanding and had no further questions.

## 2023-04-24 ENCOUNTER — Ambulatory Visit: Payer: HMO | Attending: Cardiology

## 2023-04-24 DIAGNOSIS — R079 Chest pain, unspecified: Secondary | ICD-10-CM

## 2023-04-24 LAB — MYOCARDIAL PERFUSION IMAGING
Angina Index: 0
LV dias vol: 48 mL (ref 46–106)
LV sys vol: 10 mL
Nuc Stress EF: 79 %
Peak HR: 126 {beats}/min
Rest HR: 69 {beats}/min
Rest Nuclear Isotope Dose: 10.7 mCi
SDS: 0
SRS: 0
SSS: 0
Stress Nuclear Isotope Dose: 31.5 mCi
TID: 1.05

## 2023-04-24 MED ORDER — TECHNETIUM TC 99M TETROFOSMIN IV KIT
10.7000 | PACK | Freq: Once | INTRAVENOUS | Status: AC | PRN
  Administered 2023-04-24: 10.7 via INTRAVENOUS

## 2023-04-24 MED ORDER — TECHNETIUM TC 99M TETROFOSMIN IV KIT
31.5000 | PACK | Freq: Once | INTRAVENOUS | Status: AC | PRN
  Administered 2023-04-24: 31.5 via INTRAVENOUS

## 2023-05-09 ENCOUNTER — Other Ambulatory Visit: Payer: Self-pay | Admitting: Family Medicine

## 2023-05-09 DIAGNOSIS — I10 Essential (primary) hypertension: Secondary | ICD-10-CM

## 2023-05-09 NOTE — Telephone Encounter (Signed)
 Copied from CRM [id number]. Topic: Clinical - Medication Refill >> May 09, 2023  3:58 PM Fuller Mandril wrote: Most Recent Primary Care Visit:  Provider: Renne Crigler  Department: [facility]  Visit Type: OFFICE VISIT  Date: 04/23/2023  Medication: Patient states she is almost due for refills for the follow medications and wanted to see if she should refill and keep taking as is. Has been taking bp at home last night was normal and today was 135/85. bisoprolol-hydrochlorothiazide (ZIAC) 2.5-6.25 MG tablet olmesartan (BENICAR) 5 MG tablet   Has the patient contacted their pharmacy? Yes (Agent: If no, request that the patient contact the pharmacy for the refill. If patient does not wish to contact the pharmacy document the reason why and proceed with request.) (Agent: If yes, when and what did the pharmacy advise?) no refills on olmesartan (BENICAR) 5 MG tablet   Is this the correct pharmacy for this prescription? Yes If no, delete pharmacy and type the correct one.  This is the patient's preferred pharmacy:  [facility] [address], [place] - [address] [facility] [place] [address] Phone: [phone] Fax: [phone]   Has the prescription been filled recently? Yes  Is the patient out of the medication? No - 6 days left   Has the patient been seen for an appointment in the last year OR does the patient have an upcoming appointment? Yes  Can we respond through MyChart? Yes  Agent: Please be advised that Rx refills may take up to 3 business days. We ask that you follow-up with your pharmacy.

## 2023-05-10 MED ORDER — OLMESARTAN MEDOXOMIL 5 MG PO TABS: 5.0000 mg | ORAL_TABLET | Freq: Every day | ORAL | 0 refills | Status: DC

## 2023-05-10 MED ORDER — BISOPROLOL-HYDROCHLOROTHIAZIDE 2.5-6.25 MG PO TABS
1.0000 | ORAL_TABLET | Freq: Every day | ORAL | 0 refills | Status: DC
Start: 2023-05-10 — End: 2023-05-30

## 2023-05-13 NOTE — Telephone Encounter (Signed)
 Patient is calling checking on the medication refill cause she had not heard anything back from anyone . Relayed to the patient that I seen medication was sent to pharmacy on 3/21. Patient said ok and thank you

## 2023-05-30 ENCOUNTER — Other Ambulatory Visit: Payer: Self-pay | Admitting: Family Medicine

## 2023-05-30 DIAGNOSIS — I10 Essential (primary) hypertension: Secondary | ICD-10-CM

## 2023-06-01 IMAGING — CT CT ABD-PELV W/ CM
2 of 5 series · 16 of 46 positions shown, 18 images · IV contrast (Omnipaque)
Comparison: None Available.

CLINICAL DATA: LLQ abdominal pain

EXAM:
CT ABDOMEN AND PELVIS WITH CONTRAST
TECHNIQUE: Multidetector CT imaging of the abdomen and pelvis was performed
using the standard protocol following bolus administration of
intravenous contrast.

[Series 2: axial st · axial · 0.76mm/px · z∈[-404,-64]mm · 13 of 76 slices shown, 15 images]
[im 4/76  soft-tissue]
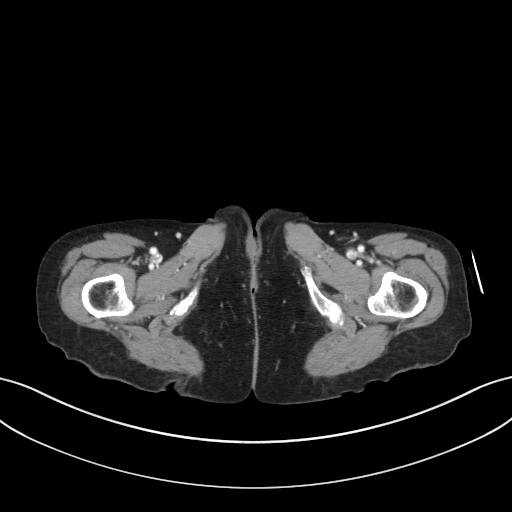
[im 4/76  bone]
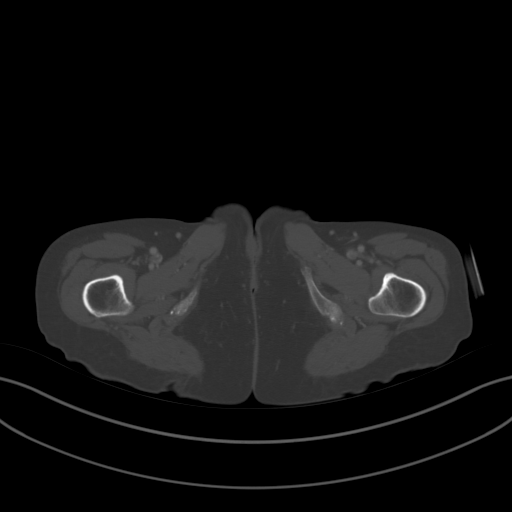
[im 12/76  soft-tissue]
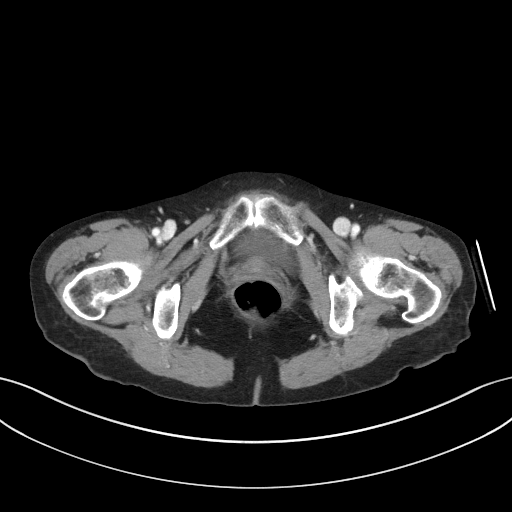
[im 16/76  soft-tissue]
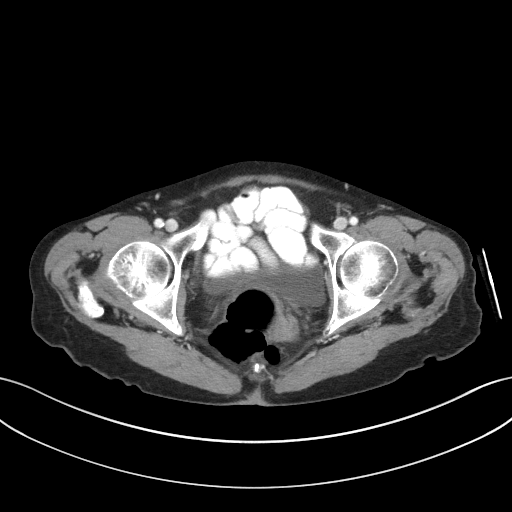
[im 23/76  soft-tissue]
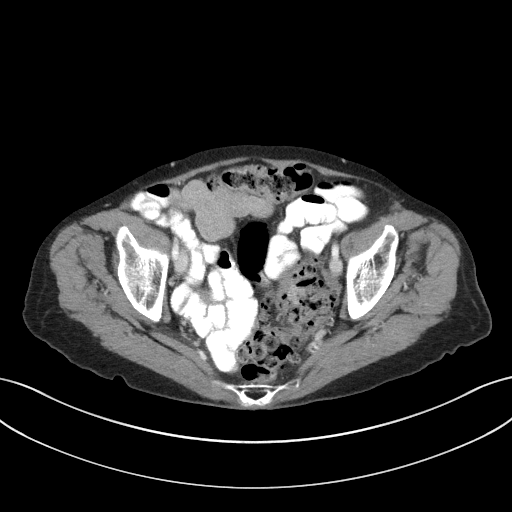
[im 27/76  soft-tissue]
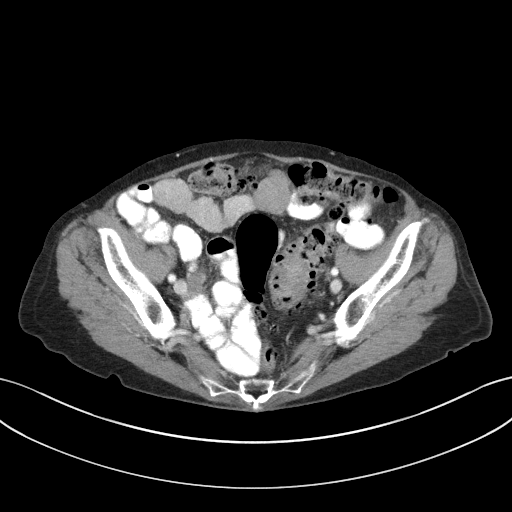
[im 34/76  soft-tissue]
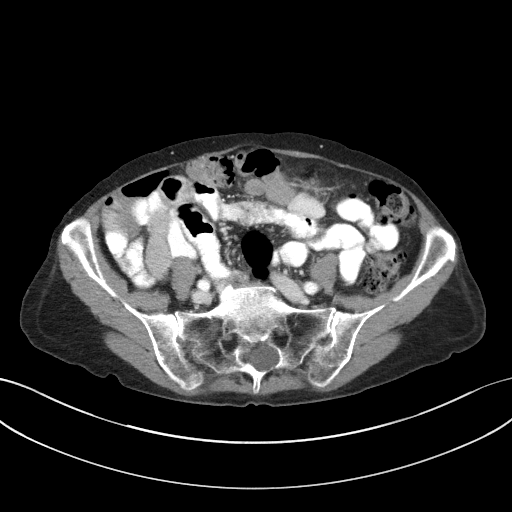
[im 38/76  soft-tissue]
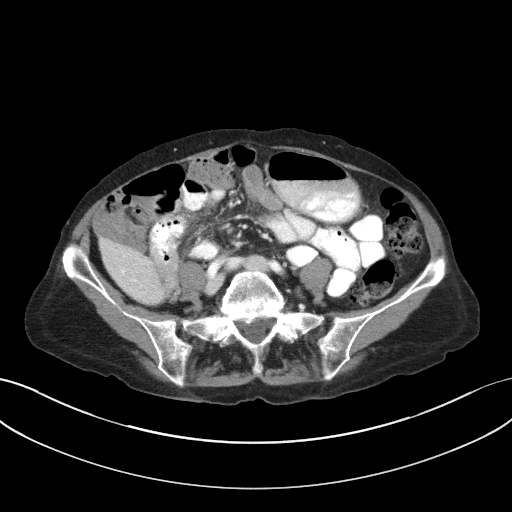
[im 42/76  soft-tissue]
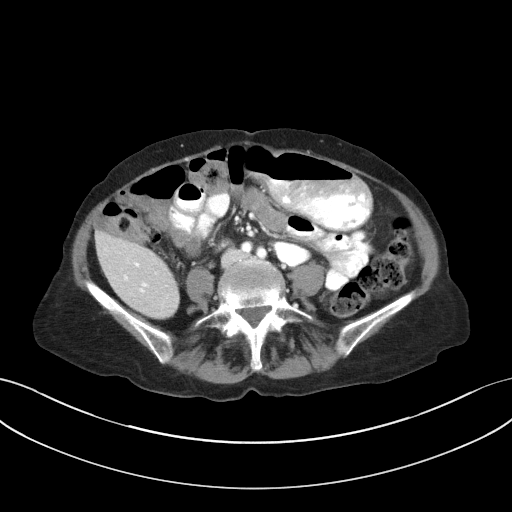
[im 49/76  soft-tissue]
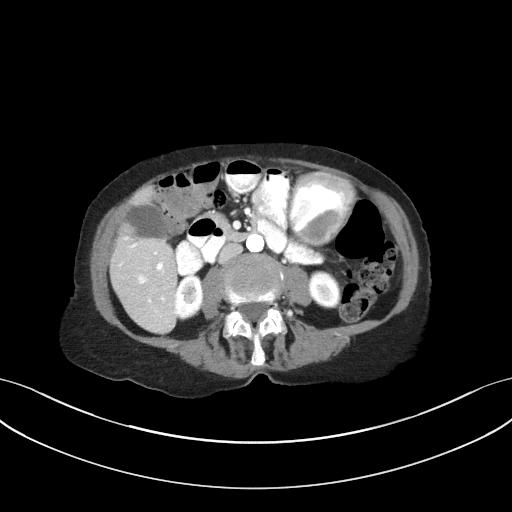
[im 49/76  bone]
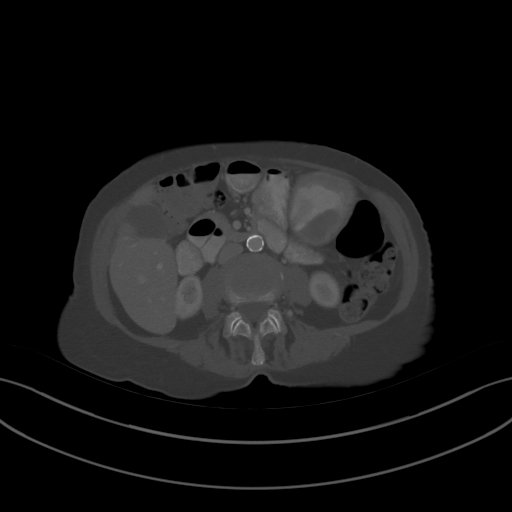
[im 53/76  soft-tissue]
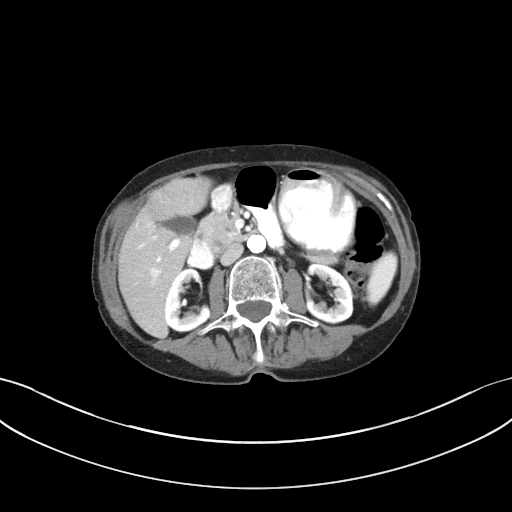
[im 61/76  soft-tissue]
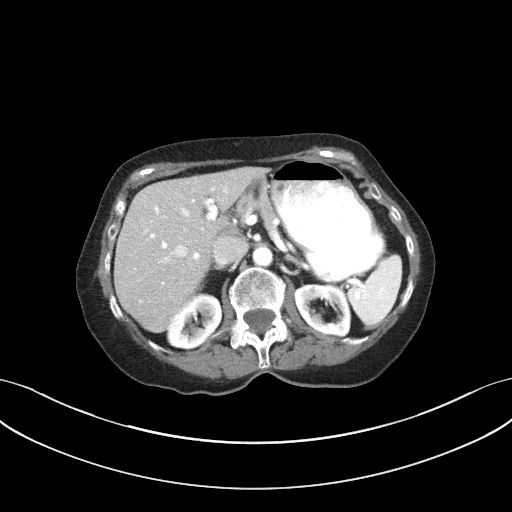
[im 64/76  soft-tissue]
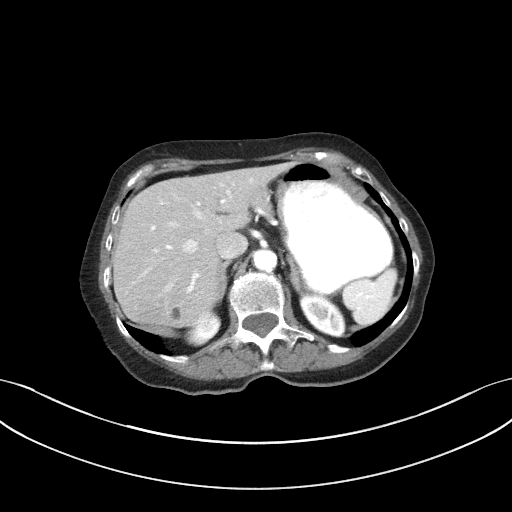
[im 72/76  soft-tissue]
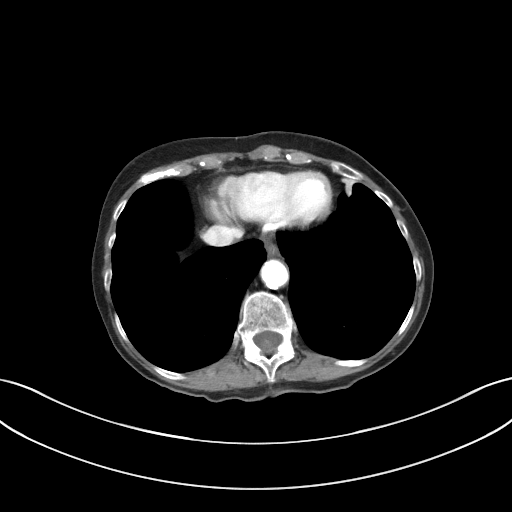

[Series 5: coronal st · coronal · 0.71mm/px · 3 of 88 slices shown]
[im 30/88  soft-tissue]
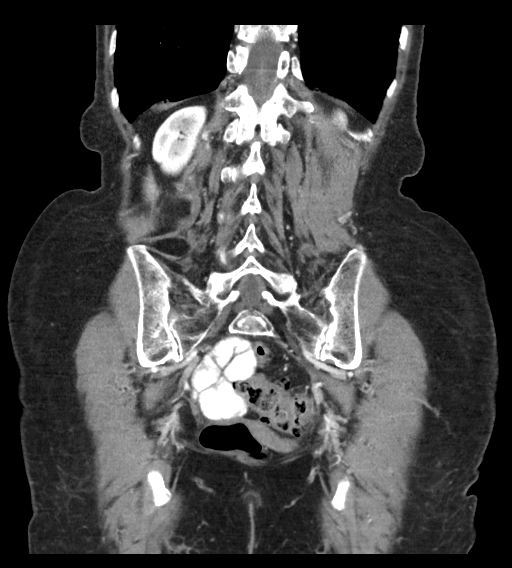
[im 39/88  soft-tissue]
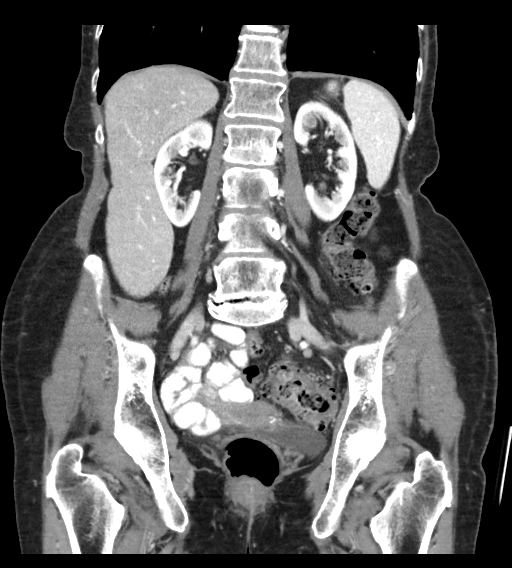
[im 49/88  soft-tissue]
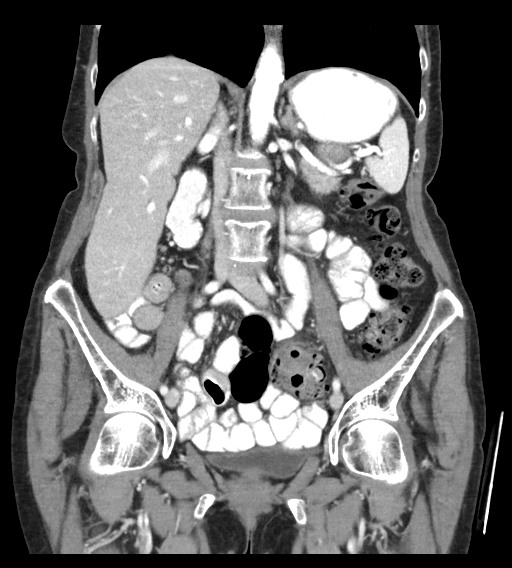

[16 of 46 positions shown; findings below may reference images not displayed]

RADIATION DOSE REDUCTION: This exam was performed according to the
departmental dose-optimization program which includes automated
exposure control, adjustment of the mA and/or kV according to
patient size and/or use of iterative reconstruction technique.

CONTRAST:  100mL OMNIPAQUE IOHEXOL 300 MG/ML  SOLN
FINDINGS: Lower chest: No acute abnormality.

Hepatobiliary: No solid hepatic mass. Total 2 small subcentimeter
hypodensities in the right hepatic lobe likely reflecting small
cysts. No gallstones, gallbladder wall thickening, or biliary
dilatation.

Pancreas: Unremarkable. No pancreatic ductal dilatation or
surrounding inflammatory changes.

Spleen: Normal in size without focal abnormality.

Adrenals/Urinary Tract: Adrenal glands are unremarkable. Kidneys are
normal, without renal calculi, focal lesion, or hydronephrosis.
Bladder is unremarkable.

Stomach/Bowel: Stomach is within normal limits. No evidence of bowel
wall thickening, distention, or inflammatory changes. Large amount
of stool throughout the colon. Extensive diverticulosis without
evidence of diverticulitis.

Vascular/Lymphatic: Aortic atherosclerosis. No enlarged abdominal or
pelvic lymph nodes.

Reproductive: Status post hysterectomy. No adnexal masses.

Other: No abdominal wall hernia or abnormality. No abdominopelvic
ascites.

Musculoskeletal: No acute osseous abnormality. No aggressive osseous
lesion. Mild osteoarthritis of bilateral SI joints. Degenerative
disease with disc height loss at L5-S1.
IMPRESSION: 1. No acute abdominal or pelvic pathology.
2. Extensive diverticulosis without evidence of diverticulitis.
3. Large amount of stool throughout the colon.
4. Aortic Atherosclerosis ([id number]).

## 2023-06-13 ENCOUNTER — Other Ambulatory Visit: Payer: Self-pay | Admitting: Family Medicine

## 2023-06-13 DIAGNOSIS — I10 Essential (primary) hypertension: Secondary | ICD-10-CM

## 2023-07-17 ENCOUNTER — Other Ambulatory Visit: Payer: Self-pay | Admitting: Family Medicine

## 2023-07-17 DIAGNOSIS — I10 Essential (primary) hypertension: Secondary | ICD-10-CM

## 2023-07-17 MED ORDER — BISOPROLOL-HYDROCHLOROTHIAZIDE 2.5-6.25 MG PO TABS: ORAL_TABLET | ORAL | 0 refills | Status: DC

## 2023-07-17 NOTE — Telephone Encounter (Signed)
 Copied from CRM [id number]. Topic: Clinical - Medication Refill >> Jul 17, 2023  8:48 AM Hamp Levine R wrote: Medication: bisoprolol -hydrochlorothiazide  (ZIAC ) 2.5-6.25 MG tablet (Patient is requesting a refill of 100 tablets since the dosage changed)  Has the patient contacted their pharmacy? Yes, call dr  This is the patient's preferred pharmacy:  [facility] [address], [place] - [address] [facility] [place] [address] Phone: [phone] Fax: [phone]  Is this the correct pharmacy for this prescription? Yes If no, delete pharmacy and type the correct one.   Has the prescription been filled recently? No  Is the patient out of the medication? No, enough until monday  Has the patient been seen for an appointment in the last year OR does the patient have an upcoming appointment? Yes  Can we respond through MyChart? Yes  Agent: Please be advised that Rx refills may take up to 3 business days. We ask that you follow-up with your pharmacy.

## 2023-07-22 NOTE — Telephone Encounter (Signed)
 Patient received a refill of bisoprolol -hydrochlorothiazide  1/2 tablet daily and she mentioned after her visit on 04/15/23 was increased to 1 tablet daily. BP 131/75 today,  Last night 99/63, yesterday 137/78, sat 97/59.  Dr Reinhold Carbine recommended to take 1/2 pill of Bisoprolol -hydrochlorothiazide  daily and take olmesartan  in the afternoon. She will call us  on Friday to let us  know how is she doing with the medication. Patient verbalized to understand.   Copied from CRM [id number]. Topic: Clinical - Prescription Issue >> Jul 22, 2023  8:05 AM Everette C wrote: Reason for CRM: The patient would like to be contacted to discuss their recently received prescription for bisoprolol -hydrochlorothiazide  (ZIAC ) 2.5-6.25 MG tablet [[id number]]   The patient is uncertain of why they've been instructed to take 1/2 a tablet rather than a whole tablet per day, please contact the patient to discuss further if/when possible

## 2023-07-31 ENCOUNTER — Ambulatory Visit: Payer: Self-pay

## 2023-07-31 ENCOUNTER — Ambulatory Visit: Admitting: Family Medicine

## 2023-07-31 ENCOUNTER — Encounter: Payer: Self-pay | Admitting: Family Medicine

## 2023-07-31 DIAGNOSIS — I10 Essential (primary) hypertension: Secondary | ICD-10-CM

## 2023-07-31 MED ORDER — BISOPROLOL-HYDROCHLOROTHIAZIDE 2.5-6.25 MG PO TABS
ORAL_TABLET | ORAL | 1 refills | Status: DC
Start: 2023-07-31 — End: 2023-09-16

## 2023-07-31 NOTE — Telephone Encounter (Signed)
 FYI Only or Action Required?: FYI only for provider  Patient was last seen in primary care on 04/23/2023 by Janece Means, FNP. Called Nurse Triage reporting No chief complaint on file.. Symptoms began several days ago. Interventions attempted: Prescription medications: Rx Hypertension Medications. Symptoms are: gradually worsening.  Triage Disposition: No disposition on file.  Patient/caregiver understands and will follow disposition?:   Copied from CRM [id number]. Topic: Clinical - Red Word Triage >> Jul 31, 2023 10:36 AM Dawn Harris wrote: Red Word that prompted transfer to Nurse Triage: BP over 100 for the diastolic number. Reason for Disposition  Systolic BP  >= 180 OR Diastolic >= 110  Answer Assessment - Initial Assessment Questions 1. BLOOD PRESSURE: What is the blood pressure? Did you take at least two measurements 5 minutes apart?     149/129, 141/93  2. ONSET: When did you take your blood pressure?     This morning  3. HOW: How did you take your blood pressure? (e.g., automatic home BP monitor, visiting nurse)     Automatic Blood Pressure Cuff  4. HISTORY: Do you have a history of high blood pressure?     Yes  5. MEDICINES: Are you taking any medicines for blood pressure? Have you missed any doses recently?     Recent medication change by provider, no missed doses  6. OTHER SYMPTOMS: Do you have any symptoms? (e.g., blurred vision, chest pain, difficulty breathing, headache, weakness)     Headache  7. PREGNANCY: Is there any chance you are pregnant? When was your last menstrual period?     No and No  Protocols used: Blood Pressure - High-A-AH

## 2023-07-31 NOTE — Progress Notes (Signed)
 Acute Office Visit  Subjective:    Patient ID: Dawn Harris, female    DOB: [birth year], 83 y.o.   MRN: [id number]  Chief Complaint  Patient presents with   Hypertension   Discussed the use of AI scribe software for clinical note transcription with the patient, who gave verbal consent to proceed.  History of Present Illness   Dawn Harris is an 83 year old female with hypertension who presents with elevated blood pressure readings.  Over the past week, she has experienced elevated blood pressure readings, particularly in the mornings. Her blood pressure was previously well-controlled on a whole tablet of bisoprolol  hydrochlorothiazide  daily. However, after switching to half a tablet, her blood pressure has been increasing. A recent concerning instance was a reading of 147/129 mmHg. She has been monitoring her blood pressure at home, observing higher readings in the mornings that decrease after taking her medication.  She experiences occasional headaches upon waking, which sometimes correlate with higher blood pressure readings, although not consistently. She denies experiencing dizziness or other symptoms associated with her elevated blood pressure, except for these occasional headaches.  Her current medication regimen includes half a tablet of bisoprolol  hydrochlorothiazide  daily and olmesartan  as needed in the evenings if her blood pressure is not low. She avoids taking olmesartan  if her evening blood pressure is low, such as 97/59 mmHg. She has been on this adjusted regimen for about a week. She has a history of taking a whole tablet of bisoprolol  hydrochlorothiazide  for years without issues until recently when her blood pressure started to rise.        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   Carotid artery occlusion    Chest pain on exertion 04/15/2023   Diverticulosis 10/19/2019   Diverticulosis 10/19/2019   Dizziness 09/21/2020    Dysuria 01/20/2023   Encounter for Medicare annual wellness exam 12/10/2022   Encounter for osteoporosis screening in asymptomatic postmenopausal patient 12/10/2022   Essential hypertension 10/19/2019   GERD (gastroesophageal reflux disease) 10/19/2019   Internal hemorrhoid 10/19/2019   Lumbar back pain 01/20/2023   Mild mitral regurgitation by prior echocardiogram 01/20/2023   Mixed hyperlipidemia 12/10/2022   Murmur 12/10/2022   Serum potassium elevated 04/15/2023    Past Surgical History:  Procedure Laterality Date   CATARACT EXTRACTION      Family History  Problem Relation Age of Onset   Hypertension Mother    Heart disease Father    Cancer Father    Diabetes Neg Hx     Social History   Socioeconomic History   Marital status: Married    Spouse name: Not on file   Number of children: 2   Years of education: Not on file   Highest education level: 12th grade  Occupational History   Occupation: Retired  Tobacco Use   Smoking status: Never   Smokeless tobacco: Never  Vaping Use   Vaping status: Never Used  Substance and Sexual Activity   Alcohol use: Never   Drug use: Never   Sexual activity: Not Currently    Partners: Male  Other Topics Concern   Not on file  Social History Narrative   Not on file   Social Drivers of Health   Financial Resource Strain: Low Risk  (04/15/2023)   Overall Financial Resource Strain (CARDIA)    Difficulty of Paying Living Expenses: Not hard at all  Food Insecurity: No Food Insecurity (04/15/2023)   Hunger Vital Sign    Worried About Running  Out of Food in the Last Year: Never true    Ran Out of Food in the Last Year: Never true  Transportation Needs: No Transportation Needs (04/15/2023)   PRAPARE - Administrator, Civil Service (Medical): No    Lack of Transportation (Non-Medical): No  Physical Activity: Insufficiently Active (04/15/2023)   Exercise Vital Sign    Days of Exercise per Week: 2 days    Minutes of  Exercise per Session: 20 min  Stress: No Stress Concern Present (01/16/2023)   [employer] of Occupational Health - Occupational Stress Questionnaire    Feeling of Stress : Only a little  Social Connections: Socially Integrated (04/15/2023)   Social Connection and Isolation Panel [NHANES]    Frequency of Communication with Friends and Family: More than three times a week    Frequency of Social Gatherings with Friends and Family: Patient declined    Attends Religious Services: More than 4 times per year    Active Member of [employer] or Organizations: Yes    Attends Engineer, structural: More than 4 times per year    Marital Status: Married  Catering manager Violence: Not At Risk (12/10/2022)   Humiliation, Afraid, Rape, and Kick questionnaire    Fear of Current or Ex-Partner: No    Emotionally Abused: No    Physically Abused: No    Sexually Abused: No    Outpatient Medications Prior to Visit  Medication Sig Dispense Refill   atorvastatin  (LIPITOR) 10 MG tablet Take 1 tablet (10 mg total) by mouth daily. 90 tablet 3   calcium -vitamin D  (OSCAL WITH D) 500-200 MG-UNIT tablet Take 1 tablet by mouth daily.     Multiple Vitamins-Minerals (MULTIVITAMIN WITH MINERALS) tablet Take 1 tablet by mouth daily.     olmesartan  (BENICAR ) 5 MG tablet Take 1 tablet by mouth once daily 30 tablet 0   bisoprolol -hydrochlorothiazide  (ZIAC ) 2.5-6.25 MG tablet Take 1/2 tablet daily 100 tablet 0   aspirin EC 81 MG tablet Take 81 mg by mouth daily. Swallow whole. (Patient not taking: Reported on 04/23/2023)     nitroGLYCERIN  (NITROSTAT ) 0.4 MG SL tablet Place 1 tablet (0.4 mg total) under the tongue every 5 (five) minutes as needed. 25 tablet 6   No facility-administered medications prior to visit.    Allergies  Allergen Reactions   Penicillins Hives    Review of Systems  Constitutional:  Negative for chills, diaphoresis, fatigue and fever.  HENT:  Negative for congestion, ear pain and sinus  pain.   Eyes: Negative.   Respiratory:  Negative for cough and shortness of breath.   Cardiovascular:  Negative for chest pain.  Gastrointestinal:  Negative for abdominal pain, constipation, nausea and vomiting.  Endocrine: Negative.   Genitourinary:  Negative for dysuria.  Musculoskeletal:  Negative for arthralgias.  Skin: Negative.   Allergic/Immunologic: Negative.   Neurological:  Positive for headaches (occasional). Negative for weakness.  Hematological: Negative.   Psychiatric/Behavioral:  Negative for dysphoric mood. The patient is not nervous/anxious.        Objective:         07/31/2023    2:11 PM 07/31/2023    1:30 PM 04/24/2023    7:54 AM  Vitals with BMI  Height  5' 4 5' 4  Weight  93 lbs 6 oz 92 lbs  BMI  16.02 15.78  Systolic 160 140   Diastolic 70 62   Pulse  66     Orthostatic VS for the past 72 hrs (Last  3 readings):  Patient Position BP Location  07/31/23 1411 Sitting Left Arm     Physical Exam Vitals reviewed. Exam conducted with a chaperone present.  Constitutional:      General: She is not in acute distress.    Appearance: Normal appearance. She is not ill-appearing.  Eyes:     Conjunctiva/sclera: Conjunctivae normal.  Cardiovascular:     Rate and Rhythm: Normal rate and regular rhythm.     Heart sounds: Normal heart sounds. No murmur heard. Pulmonary:     Effort: Pulmonary effort is normal.     Breath sounds: Normal breath sounds. No wheezing.  Chest:  Breasts:    Breasts are symmetrical.     Right: Normal.     Left: Normal.  Genitourinary:    Vagina: Normal.     Cervix: Normal.     Uterus: Normal.      Adnexa: Right adnexa normal.     Rectum: Normal.  Musculoskeletal:        General: Normal range of motion.     Cervical back: Normal range of motion.  Skin:    General: Skin is warm.  Neurological:     Mental Status: She is alert. Mental status is at baseline.  Psychiatric:        Mood and Affect: Mood normal.        Behavior:  Behavior normal.     Health Maintenance Due  Topic Date Due   Zoster Vaccines- Shingrix (1 of 2) Never done   COVID-19 Vaccine (5 - 2024-25 season) 06/10/2023    There are no preventive care reminders to display for this patient.   Lab Results  Component Value Date   TSH 3.620 12/10/2022   Lab Results  Component Value Date   WBC 6.0 12/10/2022   HGB 13.7 12/10/2022   HCT 40.0 12/10/2022   MCV 92 12/10/2022   PLT 290 12/10/2022   Lab Results  Component Value Date   NA 135 04/15/2023   K 4.5 04/15/2023   CO2 25 04/15/2023   GLUCOSE 86 04/15/2023   BUN 14 04/15/2023   CREATININE 0.99 04/15/2023   BILITOT 0.3 04/15/2023   ALKPHOS 73 04/15/2023   AST 22 04/15/2023   ALT 13 04/15/2023   PROT 7.2 04/15/2023   ALBUMIN 4.7 04/15/2023   CALCIUM  9.7 04/15/2023   EGFR 57 (L) 04/15/2023   Lab Results  Component Value Date   CHOL 238 (H) 03/18/2023   Lab Results  Component Value Date   HDL 66 03/18/2023   Lab Results  Component Value Date   LDLCALC 140 (H) 03/18/2023   Lab Results  Component Value Date   TRIG 181 (H) 03/18/2023   Lab Results  Component Value Date   CHOLHDL 3.6 03/18/2023   No results found for: HGBA1C     Assessment & Plan:  Essential hypertension Assessment & Plan: Hypertension with morning elevations. Reverted to full tablet of bisoprolol  hydrochlorothiazide  for better control. Discussed specialist consultation; she opted for current management. BP Readings from Last 3 Encounters:  07/31/23 (!) 160/70  04/23/23 122/64  04/17/23 (!) 140/72   - Revert to full tablet of bisoprolol  hydrochlorothiazide  daily. - Continue olmesartan  if evening blood pressure is not low. - Provide new prescription for full tablet dose. - Schedule follow-up nurse visit for blood pressure check in two weeks. - Instruct to take medication before follow-up visit.   Orders: -     Bisoprolol -hydroCHLOROthiazide ; Take 1 tablet daily  Dispense: 100 tablet;  Refill: 1     Meds ordered this encounter  Medications   bisoprolol -hydrochlorothiazide  (ZIAC ) 2.5-6.25 MG tablet    Sig: Take 1 tablet daily    Dispense:  100 tablet    Refill:  1      Follow-up: Return in about 2 weeks (around 08/14/2023) for BP recheck, nurse visit.  An After Visit Summary was printed and given to the patient.  Delford Felling, FNP [facility] [phone]

## 2023-07-31 NOTE — Assessment & Plan Note (Signed)
 Hypertension with morning elevations. Reverted to full tablet of bisoprolol  hydrochlorothiazide  for better control. Discussed specialist consultation; she opted for current management. BP Readings from Last 3 Encounters:  07/31/23 (!) 160/70  04/23/23 122/64  04/17/23 (!) 140/72   - Revert to full tablet of bisoprolol  hydrochlorothiazide  daily. - Continue olmesartan  if evening blood pressure is not low. - Provide new prescription for full tablet dose. - Schedule follow-up nurse visit for blood pressure check in two weeks. - Instruct to take medication before follow-up visit.

## 2023-08-07 ENCOUNTER — Other Ambulatory Visit: Payer: Self-pay | Admitting: Family Medicine

## 2023-08-07 DIAGNOSIS — I10 Essential (primary) hypertension: Secondary | ICD-10-CM

## 2023-08-14 ENCOUNTER — Ambulatory Visit

## 2023-08-14 VITALS — BP 108/60 | HR 68

## 2023-08-14 DIAGNOSIS — I10 Essential (primary) hypertension: Secondary | ICD-10-CM

## 2023-08-14 NOTE — Progress Notes (Signed)
   Blood Pressure Recheck Visit  Name: Dawn Harris MRN: [id number] Date of Birth: September 24, 1940  Kirandeep Fariss presents today for Blood Pressure recheck with clinical support staff.  Order for BP recheck by Harrie Cedar, ordered on 07/31/23.   BP Readings from Last 3 Encounters:  08/14/23 108/60  07/31/23 (!) 160/70  04/23/23 122/64    Current Outpatient Medications  Medication Sig Dispense Refill   atorvastatin  (LIPITOR) 10 MG tablet Take 1 tablet (10 mg total) by mouth daily. 90 tablet 3   bisoprolol -hydrochlorothiazide  (ZIAC ) 2.5-6.25 MG tablet Take 1 tablet daily 100 tablet 1   calcium -vitamin D  (OSCAL WITH D) 500-200 MG-UNIT tablet Take 1 tablet by mouth daily.     Multiple Vitamins-Minerals (MULTIVITAMIN WITH MINERALS) tablet Take 1 tablet by mouth daily.     olmesartan  (BENICAR ) 5 MG tablet Take 1 tablet by mouth once daily 30 tablet 2   No current facility-administered medications for this visit.    Hypertensive Medication Review: Patient states that they are taking all their hypertensive medications as prescribed and their last dose of hypertensive medications was this morning   Patient states that she is alternating her BP medication dosage - whole pill one day then half a pill the next day and it seems to be working well for her.  Her next scheduled appointment is 09/16/23 with Dr Sherre.

## 2023-09-16 ENCOUNTER — Encounter: Payer: Self-pay | Admitting: Family Medicine

## 2023-09-16 ENCOUNTER — Ambulatory Visit (INDEPENDENT_AMBULATORY_CARE_PROVIDER_SITE_OTHER): Payer: HMO | Admitting: Family Medicine

## 2023-09-16 VITALS — BP 134/74 | HR 58 | Temp 98.2°F | Ht 64.0 in | Wt 92.2 lb

## 2023-09-16 DIAGNOSIS — I10 Essential (primary) hypertension: Secondary | ICD-10-CM

## 2023-09-16 DIAGNOSIS — E782 Mixed hyperlipidemia: Secondary | ICD-10-CM | POA: Diagnosis not present

## 2023-09-16 MED ORDER — OLMESARTAN MEDOXOMIL 5 MG PO TABS: 5.0000 mg | ORAL_TABLET | Freq: Two times a day (BID) | ORAL | 1 refills | Status: DC

## 2023-09-16 NOTE — Progress Notes (Unsigned)
 Subjective:  Patient ID: Dawn Harris, female    DOB: 24-Sep-1940  Age: 83 y.o. MRN: [id number]  Chief Complaint  Patient presents with   Medical Management of Chronic Issues    HPI:  HTN: Taking ziac  2.5-6.25 mg 1/2 daily and olmesartan  5mg  daily - states she only takes her bp medication depending on if her bp is elevated or not. BP 111/73 - 137/84. Check twice daily.  Cholesterol: Lipitor 10mg  daily  Discussed the use of AI scribe software for clinical note transcription with the patient, who gave verbal consent to proceed.  History of Present Illness  Dawn Harris is an 83 year old female with hypertension who presents for a six-month follow-up visit.  Hypertension management - Monitors blood pressure at home twice daily with generally good control, occasional elevations - Blood pressure more normal in the evening - Adjusts olmesartan  (Benicar ) and Ziac  doses based on home readings, but expresses confusion about the regimen - Cautious with full dosing due to prior hypotensive episodes, including a drop below 100 mmHg two to three months ago, leading to dose reduction - No chest pain or shortness of breath  Statin therapy and myalgias - Takes atorvastatin  10 mg for hyperlipidemia, but often forgets doses, typically taking it two to three times per week - Attempts to take atorvastatin  in the late afternoon, ensuring a two-hour gap after calcium  or multivitamin intake to avoid interactions - Experiences muscle aches with daily use, prompting reduced frequency of dosing  Physical activity - Engages in increased physical activity during the summer, including gardening and weed eating - Believes increased activity helps with blood pressure control compared to winter months        09/16/2023   10:17 AM 03/18/2023    1:41 PM 12/10/2022    8:02 AM 06/23/2021    8:56 AM 10/05/2020    9:20 AM  Depression screen PHQ 2/9  Decreased Interest 0 0 0 0 0  Down, Depressed, Hopeless 0 0  0 0 0  PHQ - 2 Score 0 0 0 0 0  Altered sleeping 0      Tired, decreased energy 0      Change in appetite 0      Feeling bad or failure about yourself  0      Trouble concentrating 0      Moving slowly or fidgety/restless 0      Suicidal thoughts 0      PHQ-9 Score 0      Difficult doing work/chores Not difficult at all            09/16/2023   10:17 AM  Fall Risk   Falls in the past year? 1  Number falls in past yr: 0  Injury with Fall? 0  Risk for fall due to : No Fall Risks  Follow up Falls evaluation completed    Patient Care Team: Sherre Clapper, MD as PCP - General (Family Medicine)   Review of Systems  Constitutional:  Negative for chills, fatigue and fever.  HENT:  Negative for congestion, ear pain, rhinorrhea and sore throat.   Respiratory:  Negative for cough and shortness of breath.   Cardiovascular:  Negative for chest pain.  Gastrointestinal:  Negative for abdominal pain, constipation, diarrhea, nausea and vomiting.  Genitourinary:  Negative for dysuria and urgency.  Musculoskeletal:  Negative for back pain and myalgias.  Neurological:  Negative for dizziness, weakness, light-headedness and headaches (only when her bp is elevated.).  Psychiatric/Behavioral:  Negative for  dysphoric mood. The patient is not nervous/anxious.     Current Outpatient Medications on File Prior to Visit  Medication Sig Dispense Refill   atorvastatin  (LIPITOR) 10 MG tablet Take 1 tablet (10 mg total) by mouth daily. 90 tablet 3   calcium -vitamin D  (OSCAL WITH D) 500-200 MG-UNIT tablet Take 1 tablet by mouth daily.     Multiple Vitamins-Minerals (MULTIVITAMIN WITH MINERALS) tablet Take 1 tablet by mouth daily.     No current facility-administered medications on file prior to visit.   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   Carotid artery occlusion    Chest pain on exertion 04/15/2023   Diverticulosis 10/19/2019   Diverticulosis 10/19/2019    Dizziness 09/21/2020   Dysuria 01/20/2023   Encounter for Medicare annual wellness exam 12/10/2022   Encounter for osteoporosis screening in asymptomatic postmenopausal patient 12/10/2022   Essential hypertension 10/19/2019   GERD (gastroesophageal reflux disease) 10/19/2019   Internal hemorrhoid 10/19/2019   Lumbar back pain 01/20/2023   Mild mitral regurgitation by prior echocardiogram 01/20/2023   Mixed hyperlipidemia 12/10/2022   Murmur 12/10/2022   Serum potassium elevated 04/15/2023   Past Surgical History:  Procedure Laterality Date   CATARACT EXTRACTION      Family History  Problem Relation Age of Onset   Hypertension Mother    Heart disease Father    Cancer Father    Diabetes Neg Hx    Social History   Socioeconomic History   Marital status: Married    Spouse name: Not on file   Number of children: 2   Years of education: Not on file   Highest education level: 12th grade  Occupational History   Occupation: Retired  Tobacco Use   Smoking status: Never   Smokeless tobacco: Never  Vaping Use   Vaping status: Never Used  Substance and Sexual Activity   Alcohol use: Never   Drug use: Never   Sexual activity: Not Currently    Partners: Male  Other Topics Concern   Not on file  Social History Narrative   Not on file   Social Drivers of Health   Financial Resource Strain: Low Risk  (09/13/2023)   Overall Financial Resource Strain (CARDIA)    Difficulty of Paying Living Expenses: Not very hard  Food Insecurity: No Food Insecurity (09/13/2023)   Hunger Vital Sign    Worried About Running Out of Food in the Last Year: Never true    Ran Out of Food in the Last Year: Never true  Transportation Needs: No Transportation Needs (09/16/2023)   PRAPARE - Administrator, Civil Service (Medical): No    Lack of Transportation (Non-Medical): No  Physical Activity: Sufficiently Active (09/13/2023)   Exercise Vital Sign    Days of Exercise per Week: 5 days     Minutes of Exercise per Session: 40 min  Stress: No Stress Concern Present (09/13/2023)   [employer] of Occupational Health - Occupational Stress Questionnaire    Feeling of Stress: Only a little  Social Connections: Socially Integrated (09/13/2023)   Social Connection and Isolation Panel    Frequency of Communication with Friends and Family: More than three times a week    Frequency of Social Gatherings with Friends and Family: Once a week    Attends Religious Services: More than 4 times per year    Active Member of [employer] or Organizations: Yes    Attends Banker Meetings: More than 4 times per  year    Marital Status: Married    Objective:  BP 134/74   Pulse (!) 58   Temp 98.2 F (36.8 C)   Ht 5' 4 (1.626 m)   Wt 92 lb 3.2 oz (41.8 kg)   LMP  (LMP Unknown)   SpO2 98%   BMI 15.83 kg/m      09/16/2023   10:12 AM 08/14/2023    2:10 PM 07/31/2023    2:11 PM  BP/Weight  Systolic BP 134 108 160  Diastolic BP 74 60 70  Wt. (Lbs) 92.2    BMI 15.83 kg/m2      Physical Exam  {Perform Simple Foot Exam  Perform Detailed exam:1} {Insert foot Exam (Optional):30965}   Lab Results  Component Value Date   WBC 5.7 09/16/2023   HGB 13.0 09/16/2023   HCT 38.3 09/16/2023   PLT 291 09/16/2023   GLUCOSE 94 09/16/2023   CHOL 205 (H) 09/16/2023   TRIG 103 09/16/2023   HDL 70 09/16/2023   LDLCALC 117 (H) 09/16/2023   ALT 12 09/16/2023   AST 23 09/16/2023   NA 138 09/16/2023   K 4.9 09/16/2023   CL 98 09/16/2023   CREATININE 0.96 09/16/2023   BUN 13 09/16/2023   CO2 20 09/16/2023   TSH 4.000 09/16/2023      Assessment & Plan:  Mixed hyperlipidemia Assessment & Plan: On atorvastatin  10 mg once daily but often forgets. Muscle aches with daily use, currently takes 2-3 times a week. Cholesterol levels need assessment. - Order blood work to assess cholesterol levels. - Advise her to set an alarm to remember to take atorvastatin . - Continue atorvastatin  10 mg  2-3 times a week if cholesterol levels are controlled.  Orders: -     Lipid panel  Essential hypertension Assessment & Plan: Blood pressure generally well-controlled with occasional elevations. Current regimen includes olmesartan  and Ziac , but dosing confusion exists. Simplification to olmesartan  5 mg twice daily suggested. - Prescribe olmesartan  5 mg twice daily. - Discontinue Ziac . - Provide a 90-day prescription for olmesartan . - Instruct her to monitor blood pressure twice daily and note medication intake. - Advise her to contact the office if blood pressure control is inadequate.  Orders: -     Olmesartan  Medoxomil; Take 1 tablet (5 mg total) by mouth 2 (two) times daily.  Dispense: 180 tablet; Refill: 1 -     CBC with Differential/Platelet -     Comprehensive metabolic panel with GFR -     TSH     Meds ordered this encounter  Medications   olmesartan  (BENICAR ) 5 MG tablet    Sig: Take 1 tablet (5 mg total) by mouth 2 (two) times daily.    Dispense:  180 tablet    Refill:  1    Orders Placed This Encounter  Procedures   CBC with Differential/Platelet   Comprehensive metabolic panel with GFR   Lipid panel   TSH     Follow-up: Return in about 3 weeks (around 10/07/2023) for BP CHECK.   I,Katherina A Bramblett,acting as a scribe for Abigail Free, MD.,have documented all relevant documentation on the behalf of Abigail Free, MD,as directed by  Abigail Free, MD while in the presence of Abigail Free, MD.   LILLETTE Kato I Leal-Borjas,acting as a scribe for Abigail Free, MD.,have documented all relevant documentation on the behalf of Abigail Free, MD,as directed by  Abigail Free, MD while in the presence of Abigail Free, MD.    An After Visit Summary  was printed and given to the patient.  Abigail Free, MD [facility] [phone]

## 2023-09-16 NOTE — Patient Instructions (Signed)
 VISIT SUMMARY:  Today we reviewed your blood pressure and cholesterol management. We made some adjustments to your medications to help simplify your regimen and improve control. We also discussed the importance of physical activity in managing your health.  YOUR PLAN:  HYPERTENSION: Your blood pressure is generally well-controlled, but you have occasional elevations and some confusion about your medication regimen. -Start taking olmesartan  5 mg twice daily. -Stop taking Ziac . -You will receive a 90-day prescription for Olmesartan  (benicar .) -Continue to monitor your blood pressure twice daily and keep a record of your readings and medication intake. If systolic bp (upper number) is less than 110, do not take olmesartan . -Contact our office if your blood pressure is not well-controlled.  HYPERLIPIDEMIA: You are taking atorvastatin  for cholesterol, but you often forget to take it and experience muscle aches with daily use. -We will order blood work to check your cholesterol levels. -Set an alarm to help you remember to take atorvastatin . -Continue taking atorvastatin  10 mg 2-3 times a week if your cholesterol levels are controlled.  GENERAL HEALTH MAINTENANCE: You are staying active, which is great for your overall health. -Keep up with your physical activities like gardening and outdoor work.

## 2023-09-17 ENCOUNTER — Ambulatory Visit: Payer: Self-pay | Admitting: Family Medicine

## 2023-09-17 DIAGNOSIS — E782 Mixed hyperlipidemia: Secondary | ICD-10-CM

## 2023-09-17 LAB — COMPREHENSIVE METABOLIC PANEL WITH GFR
ALT: 12 IU/L (ref 0–32)
AST: 23 IU/L (ref 0–40)
Albumin: 4.6 g/dL (ref 3.7–4.7)
Alkaline Phosphatase: 67 IU/L (ref 44–121)
BUN/Creatinine Ratio: 14 (ref 12–28)
BUN: 13 mg/dL (ref 8–27)
Bilirubin Total: 0.4 mg/dL (ref 0.0–1.2)
CO2: 20 mmol/L (ref 20–29)
Calcium: 10.1 mg/dL (ref 8.7–10.3)
Chloride: 98 mmol/L (ref 96–106)
Creatinine, Ser: 0.96 mg/dL (ref 0.57–1.00)
Globulin, Total: 2.3 g/dL (ref 1.5–4.5)
Glucose: 94 mg/dL (ref 70–99)
Potassium: 4.9 mmol/L (ref 3.5–5.2)
Sodium: 138 mmol/L (ref 134–144)
Total Protein: 6.9 g/dL (ref 6.0–8.5)
eGFR: 59 mL/min/1.73 — ABNORMAL LOW (ref >59)

## 2023-09-17 LAB — CBC WITH DIFFERENTIAL/PLATELET
Basophils Absolute: 0.1 x10E3/uL (ref 0.0–0.2)
Basos: 1 %
EOS (ABSOLUTE): 0.1 x10E3/uL (ref 0.0–0.4)
Eos: 1 %
Hematocrit: 38.3 % (ref 34.0–46.6)
Hemoglobin: 13 g/dL (ref 11.1–15.9)
Immature Grans (Abs): 0 x10E3/uL (ref 0.0–0.1)
Immature Granulocytes: 0 %
Lymphocytes Absolute: 1.7 x10E3/uL (ref 0.7–3.1)
Lymphs: 30 %
MCH: 31.3 pg (ref 26.6–33.0)
MCHC: 33.9 g/dL (ref 31.5–35.7)
MCV: 92 fL (ref 79–97)
Monocytes Absolute: 0.5 x10E3/uL (ref 0.1–0.9)
Monocytes: 8 %
Neutrophils Absolute: 3.4 x10E3/uL (ref 1.4–7.0)
Neutrophils: 60 %
Platelets: 291 x10E3/uL (ref 150–450)
RBC: 4.16 x10E6/uL (ref 3.77–5.28)
RDW: 12.4 % (ref 11.7–15.4)
WBC: 5.7 x10E3/uL (ref 3.4–10.8)

## 2023-09-17 LAB — LIPID PANEL
Chol/HDL Ratio: 2.9 ratio (ref 0.0–4.4)
Cholesterol, Total: 205 mg/dL — ABNORMAL HIGH (ref 100–199)
HDL: 70 mg/dL (ref >39)
LDL Chol Calc (NIH): 117 mg/dL — ABNORMAL HIGH (ref 0–99)
Triglycerides: 103 mg/dL (ref 0–149)
VLDL Cholesterol Cal: 18 mg/dL (ref 5–40)

## 2023-09-17 LAB — TSH: TSH: 4 u[IU]/mL (ref 0.450–4.500)

## 2023-09-17 MED ORDER — ROSUVASTATIN CALCIUM 5 MG PO TABS: 5.0000 mg | ORAL_TABLET | Freq: Every day | ORAL | 3 refills | Status: AC

## 2023-09-17 NOTE — Assessment & Plan Note (Signed)
 On atorvastatin  10 mg once daily but often forgets. Muscle aches with daily use, currently takes 2-3 times a week. Cholesterol levels need assessment. - Order blood work to assess cholesterol levels. - Advise her to set an alarm to remember to take atorvastatin . - Continue atorvastatin  10 mg 2-3 times a week if cholesterol levels are controlled.

## 2023-09-17 NOTE — Assessment & Plan Note (Signed)
 Blood pressure generally well-controlled with occasional elevations. Current regimen includes olmesartan  and Ziac , but dosing confusion exists. Simplification to olmesartan  5 mg twice daily suggested. - Prescribe olmesartan  5 mg twice daily. - Discontinue Ziac . - Provide a 90-day prescription for olmesartan . - Instruct her to monitor blood pressure twice daily and note medication intake. - Advise her to contact the office if blood pressure control is inadequate.

## 2023-09-20 ENCOUNTER — Ambulatory Visit: Payer: Self-pay

## 2023-09-20 NOTE — Telephone Encounter (Signed)
 FYI Only or Action Required?: Action required by provider: Pt is requesting that provider review medications for HTN. She would like to go back to her old medications.  Patient was last seen in primary care on 09/16/2023 by Sherre Clapper, MD.  Called Nurse Triage reporting Hypertension.  Symptoms began several days ago.  Interventions attempted: Rx medications  Symptoms are: unchanged.  Triage Disposition: See PCP Within 2 Weeks  Patient/caregiver understands and will follow disposition?: No, refuses disposition                  Copied from CRM [id number]. Topic: Clinical - Red Word Triage >> Sep 20, 2023  7:51 AM Willma R wrote: [facility] that prompted transfer to Nurse Triage: Patient states her Dr changed her BP medication and since her BP has been running high and her heart has been racing. Yesterday morning her BP was 135/95 and this morning her BP 142/86 and her pulse was 81. Reason for Disposition  [1] Systolic BP >= 130 OR Diastolic >= 80 AND [2] taking BP medications  Answer Assessment - Initial Assessment Questions Pt states that BP medications were changed on Monday. Pt is reporting elevated BP's now and elevated HR. She states she feels jittery. She is wondering if she can go back to old medication protocol.               1. BLOOD PRESSURE: What is your blood pressure? Did you take at least two measurements 5 minutes apart?     142/86 2. ONSET: When did you take your blood pressure?     This morning 3. HOW: How did you take your blood pressure? (e.g., automatic home BP monitor, visiting nurse)     Home automatic  4. HISTORY: Do you have a history of high blood pressure?     yes 5. MEDICINES: Are you taking any medicines for blood pressure? Have you missed any doses recently?     Recent change in BP medications 6. OTHER SYMPTOMS: Do you have any symptoms? (e.g., blurred vision, chest pain, difficulty breathing, headache,  weakness)     Feels a little jittery  Protocols used: Blood Pressure - High-A-AH

## 2023-10-07 ENCOUNTER — Ambulatory Visit

## 2023-10-07 DIAGNOSIS — I10 Essential (primary) hypertension: Secondary | ICD-10-CM

## 2023-10-07 MED ORDER — BISOPROLOL-HYDROCHLOROTHIAZIDE 2.5-6.25 MG PO TABS: 0.5000 | ORAL_TABLET | Freq: Two times a day (BID) | ORAL | Status: DC

## 2023-10-07 NOTE — Progress Notes (Cosign Needed Addendum)
 Patient is in office today for a nurse visit for Blood Pressure Check. Patient blood pressure was 120/64, Patient No chest pain, No shortness of breath, No dyspnea on exertion, No orthopnea, No paroxysmal nocturnal dyspnea, No edema, No palpitations, No syncope.   Per Dr Sherre... Continue Bisoprolol - hydrochlorithiazide 2.5 mg, half tablet twice daily.

## 2023-10-07 NOTE — Addendum Note (Signed)
 Addended by: Alston Berrie K on: 10/07/2023 08:27 AM   Modules accepted: Orders

## 2023-10-08 ENCOUNTER — Other Ambulatory Visit: Payer: Self-pay

## 2023-10-08 ENCOUNTER — Other Ambulatory Visit: Payer: Self-pay | Admitting: Family Medicine

## 2023-10-08 MED ORDER — BISOPROLOL-HYDROCHLOROTHIAZIDE 2.5-6.25 MG PO TABS: 0.5000 | ORAL_TABLET | Freq: Two times a day (BID) | ORAL | 5 refills | Status: DC

## 2023-10-08 MED ORDER — BISOPROLOL-HYDROCHLOROTHIAZIDE 2.5-6.25 MG PO TABS: 0.5000 | ORAL_TABLET | Freq: Two times a day (BID) | ORAL | 1 refills | Status: DC

## 2023-10-08 NOTE — Telephone Encounter (Signed)
 Copied from CRM [id number]. Topic: Clinical - Prescription Issue >> Oct 08, 2023  9:56 AM Nathanel BROCKS wrote: Reason for CRM:  bisoprolol -hydrochlorothiazide  (ZIAC ) 2.5-6.25 MG per tablet 0.5 tablet  [  Pt stated that she was suppose to get this new medication yesterday. Could you please check on this rx and send it to [facility] in [place].

## 2023-10-11 ENCOUNTER — Other Ambulatory Visit: Payer: Self-pay | Admitting: Family Medicine

## 2023-10-11 MED ORDER — BISOPROLOL-HYDROCHLOROTHIAZIDE 2.5-6.25 MG PO TABS: 0.5000 | ORAL_TABLET | Freq: Two times a day (BID) | ORAL | 1 refills | Status: AC

## 2023-11-06 NOTE — Progress Notes (Signed)
   11/06/2023  Patient ID: Dawn Harris, female   DOB: 1941/02/14, 83 y.o.   MRN: [id number]  Pharmacy Quality Measure Review  This patient is appearing on a report for being at risk of failing the adherence measure for cholesterol (statin) medications this calendar year.   Medication: atorvastatin  10mg  Last fill date: 03/2023 for 90 day supply  Noted there was a change in therapy, recently changed to rosuvastatin . No action at this time.   ROSUVASTATIN  CAL 5MG  TAB  Sig: SMARTSIG:1 Tablet(s) By Mouth Daily  Dispensed: 09/17/2023 12:00 AM  Written: 09/17/2023  Days supply: 90  Dispense Note: ORIGINAL DPH:UJXZ 1 TABLET BY MOUTH ONCE DAILY[SOLD:09/19/2023]  Quantity: 90 each  Refills remaining: 3  Pharmacy: [facility] [address], [place] - [address] [facility] [place] [address] Phone: [phone] Fax: [phone]   Lang Sieve, PharmD, BCGP Clinical Pharmacist  [phone]

## 2023-12-04 NOTE — Progress Notes (Signed)
   12/04/2023  Patient ID: Dawn Harris, female   DOB: 05/21/1940, 83 y.o.   MRN: [id number]  Pharmacy Quality Measure Review  This patient is appearing on a report for being at risk of failing the adherence measure for hypertension (ACEi/ARB) medications this calendar year.   Medication: olmesartan . Filled several times then stopped. No longer on ACE/ARB therapy d/t low blood pressures   Will likely fail the quality measure for 2025. No longer taking. Med list already up to date. a  Lang Sieve, PharmD, BCGP Clinical Pharmacist  [phone]

## 2023-12-12 ENCOUNTER — Ambulatory Visit: Payer: Medicare HMO

## 2023-12-12 VITALS — Ht 64.0 in | Wt 92.0 lb

## 2023-12-12 DIAGNOSIS — Z0000 Encounter for general adult medical examination without abnormal findings: Secondary | ICD-10-CM

## 2023-12-12 NOTE — Progress Notes (Signed)
 Subjective:   Dawn Harris is a 83 y.o. who presents for a Medicare Wellness preventive visit.  As a reminder, Annual Wellness Visits don't include a physical exam, and some assessments may be limited, especially if this visit is performed virtually. We may recommend an in-person follow-up visit with your provider if needed.  Visit Complete: Virtual I connected with  Kayton Reichow on 12/12/23 by a audio enabled telemedicine application and verified that I am speaking with the correct person using two identifiers.  Patient Location: Home  Provider Location: Home Office  I discussed the limitations of evaluation and management by telemedicine. The patient expressed understanding and agreed to proceed.  Vital Signs: Because this visit was a virtual/telehealth visit, some criteria may be missing or patient reported. Any vitals not documented were not able to be obtained and vitals that have been documented are patient reported.  VideoDeclined- This patient declined Librarian, academic. Therefore the visit was completed with audio only.  Persons Participating in Visit: Patient.  AWV Questionnaire: Yes: Patient Medicare AWV questionnaire was completed by the patient on 12/11/23; I have confirmed that all information answered by patient is correct and no changes since this date.  Cardiac Risk Factors include: advanced age (>70men, >37 women);dyslipidemia;hypertension     Objective:    Today's Vitals   12/12/23 1011  Weight: 92 lb (41.7 kg)  Height: 5' 4 (1.626 m)   Body mass index is 15.79 kg/m.     12/12/2023   10:13 AM 12/10/2022    8:02 AM 06/23/2021    8:56 AM  Advanced Directives  Does Patient Have a Medical Advance Directive? No No No  Would patient like information on creating a medical advance directive? Yes (MAU/Ambulatory/Procedural Areas - Information given) No - Patient declined No - Patient declined    Current Medications  (verified) Outpatient Encounter Medications as of 12/12/2023  Medication Sig   bisoprolol -hydrochlorothiazide  (ZIAC ) 2.5-6.25 MG tablet Take 0.5 tablets by mouth 2 (two) times daily.   calcium -vitamin D  (OSCAL WITH D) 500-200 MG-UNIT tablet Take 1 tablet by mouth daily.   Multiple Vitamins-Minerals (MULTIVITAMIN WITH MINERALS) tablet Take 1 tablet by mouth daily.   rosuvastatin  (CRESTOR ) 5 MG tablet Take 1 tablet (5 mg total) by mouth daily.   No facility-administered encounter medications on file as of 12/12/2023.    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   Carotid artery occlusion    Cataract    Chest pain on exertion 04/15/2023   Diverticulosis 10/19/2019   Diverticulosis 10/19/2019   Dizziness 09/21/2020   Dysuria 01/20/2023   Encounter for Medicare annual wellness exam 12/10/2022   Encounter for osteoporosis screening in asymptomatic postmenopausal patient 12/10/2022   Essential hypertension 10/19/2019   GERD (gastroesophageal reflux disease) 10/19/2019   Internal hemorrhoid 10/19/2019   Lumbar back pain 01/20/2023   Mild mitral regurgitation by prior echocardiogram 01/20/2023   Mixed hyperlipidemia 12/10/2022   Murmur 12/10/2022   Serum potassium elevated 04/15/2023   Past Surgical History:  Procedure Laterality Date   CATARACT EXTRACTION     EYE SURGERY     Family History  Problem Relation Age of Onset   Hypertension Mother    Arthritis Mother    Heart disease Father    Cancer Father    Hearing loss Father    Diabetes Neg Hx    Social History   Socioeconomic History   Marital status:  Married    Spouse name: Not on file   Number of children: 2   Years of education: Not on file   Highest education level: 12th grade  Occupational History   Occupation: Retired  Tobacco Use   Smoking status: Never   Smokeless tobacco: Never  Vaping Use   Vaping  status: Never Used  Substance and Sexual Activity   Alcohol use: Never   Drug use: Never   Sexual activity: Not Currently    Partners: Male  Other Topics Concern   Not on file  Social History Narrative   Not on file   Social Drivers of Health   Financial Resource Strain: Low Risk  (12/11/2023)   Overall Financial Resource Strain (CARDIA)    Difficulty of Paying Living Expenses: Not hard at all  Food Insecurity: No Food Insecurity (12/11/2023)   Hunger Vital Sign    Worried About Running Out of Food in the Last Year: Never true    Ran Out of Food in the Last Year: Never true  Transportation Needs: No Transportation Needs (12/11/2023)   PRAPARE - Administrator, Civil Service (Medical): No    Lack of Transportation (Non-Medical): No  Physical Activity: Sufficiently Active (12/11/2023)   Exercise Vital Sign    Days of Exercise per Week: 5 days    Minutes of Exercise per Session: 30 min  Stress: No Stress Concern Present (12/11/2023)   [employer] of Occupational Health - Occupational Stress Questionnaire    Feeling of Stress: Only a little  Social Connections: Socially Integrated (12/11/2023)   Social Connection and Isolation Panel    Frequency of Communication with Friends and Family: More than three times a week    Frequency of Social Gatherings with Friends and Family: Once a week    Attends Religious Services: More than 4 times per year    Active Member of [employer] or Organizations: Yes    Attends Engineer, structural: More than 4 times per year    Marital Status: Married    Tobacco Counseling Counseling given: Not Answered    Clinical Intake:  Pre-visit preparation completed: Yes  Pain : No/denies pain     Diabetes: No  No results found for: HGBA1C   How often do you need to have someone help you when you read instructions, pamphlets, or other written materials from your doctor or pharmacy?: 1 - Never  Interpreter Needed?:  No  Information entered by :: Charmaine Bloodgood LPN   Activities of Daily Living     12/11/2023    4:15 PM  In your present state of health, do you have any difficulty performing the following activities:  Hearing? 0  Vision? 0  Difficulty concentrating or making decisions? 0  Walking or climbing stairs? 0  Dressing or bathing? 0  Doing errands, shopping? 0  Preparing Food and eating ? N  Using the Toilet? N  In the past six months, have you accidently leaked urine? Y  Do you have problems with loss of bowel control? N  Managing your Medications? N  Managing your Finances? N  Housekeeping or managing your Housekeeping? N    Patient Care Team: Sherre Clapper, MD as PCP - General (Family Medicine) Erasmo Bernardino BRAVO, OD (Optometry) Revankar, Jennifer SAUNDERS, MD as Consulting Physician (Cardiology) McCaughan, [place], DPM as Consulting Physician (Podiatry)  I have updated your Care Teams any recent Medical Services you may have received from other providers in the past year.  Assessment:   This is a routine wellness examination for Eula.  Hearing/Vision screen Hearing Screening - Comments:: Patient is able to hear conversational tones without difficulty. No issues reported.   Vision Screening - Comments:: Wears rx glasses - up to date with routine eye exams with [facility] ([place])    Goals Addressed             This Visit's Progress    Remain active and independent   On track      Depression Screen     12/12/2023   10:12 AM 09/16/2023   10:17 AM 03/18/2023    1:41 PM 12/10/2022    8:02 AM 06/23/2021    8:56 AM 10/05/2020    9:20 AM 10/19/2019    2:23 PM  PHQ 2/9 Scores  PHQ - 2 Score 0 0 0 0 0 0 0  PHQ- 9 Score  0         Fall Risk     12/11/2023    4:15 PM 09/16/2023   10:17 AM 12/07/2022    1:09 PM 06/23/2021    8:56 AM 10/05/2020    9:20 AM  Fall Risk   Falls in the past year? 1 1 0 0 1  Number falls in past yr: 0 0 0 0 0  Injury with Fall? 0 0 0 0 1   Risk for fall due to : History of fall(s) No Fall Risks No Fall Risks No Fall Risks   Follow up Falls evaluation completed;Education provided;Falls prevention discussed Falls evaluation completed Falls evaluation completed      MEDICARE RISK AT HOME:  Medicare Risk at Home Any stairs in or around the home?: (Patient-Rptd) No If so, are there any without handrails?: No Home free of loose throw rugs in walkways, pet beds, electrical cords, etc?: (Patient-Rptd) Yes Adequate lighting in your home to reduce risk of falls?: (Patient-Rptd) Yes Life alert?: (Patient-Rptd) No Use of a cane, walker or w/c?: (Patient-Rptd) No Grab bars in the bathroom?: (Patient-Rptd) Yes Shower chair or bench in shower?: (Patient-Rptd) Yes Elevated toilet seat or a handicapped toilet?: (Patient-Rptd) Yes  TIMED UP AND GO:  Was the test performed?  No  Cognitive Function: 6CIT completed        12/12/2023   10:13 AM 06/23/2021    8:58 AM  6CIT Screen  What Year? 0 points 0 points  What month? 0 points 0 points  What time? 0 points 0 points  Count back from 20 0 points 0 points  Months in reverse 0 points 0 points  Repeat phrase 0 points 0 points  Total Score 0 points 0 points    Immunizations Immunization History  Administered Date(s) Administered   Fluad Quad(high Dose 65+) 11/17/2021   PFIZER(Purple Top)SARS-COV-2 Vaccination 04/13/2019, 05/04/2019, 12/01/2019   Pfizer(Comirnaty)Fall Seasonal Vaccine 12 years and older 12/10/2022   Respiratory Syncytial Virus Vaccine,Recomb Aduvanted(Arexvy) 11/23/2021    Screening Tests Health Maintenance  Topic Date Due   DTaP/Tdap/Td (1 - Tdap) Never done   Pneumococcal Vaccine: 50+ Years (1 of 2 - PCV) Never done   Zoster Vaccines- Shingrix (1 of 2) Never done   COVID-19 Vaccine (5 - 2025-26 season) 10/21/2023   Influenza Vaccine  05/19/2024 (Originally 09/20/2023)   Medicare Annual Wellness (AWV)  12/11/2024   DEXA SCAN  Completed   Meningococcal B  Vaccine  Aged Out    Health Maintenance Items Addressed: Vaccines Due: Declines Vaccines (Flu, Pneumonia, Shingrix and TDap)  Additional Screening:  Vision  Screening: Recommended annual ophthalmology exams for early detection of glaucoma and other disorders of the eye. Is the patient up to date with their annual eye exam?  Yes  Who is the provider or what is the name of the office in which the patient attends annual eye exams? [facility] ([place])   Dental Screening: Recommended annual dental exams for proper oral hygiene  Community Resource Referral / Chronic Care Management: CRR required this visit?  No   CCM required this visit?  No   Plan:    I have personally reviewed and noted the following in the patient's chart:   Medical and social history Use of alcohol, tobacco or illicit drugs  Current medications and supplements including opioid prescriptions. Patient is not currently taking opioid prescriptions. Functional ability and status Nutritional status Physical activity Advanced directives List of other physicians Hospitalizations, surgeries, and ER visits in previous 12 months Vitals Screenings to include cognitive, depression, and falls Referrals and appointments  In addition, I have reviewed and discussed with patient certain preventive protocols, quality metrics, and best practice recommendations. A written personalized care plan for preventive services as well as general preventive health recommendations were provided to patient.   Lavelle Pfeiffer [place], [place]   [id number]   After Visit Summary: (MyChart) Due to this being a telephonic visit, the after visit summary with patients personalized plan was offered to patient via MyChart   Notes: Nothing significant to report at this time.

## 2023-12-12 NOTE — Patient Instructions (Signed)
 Dawn Harris,  Thank you for taking the time for your Medicare Wellness Visit. I appreciate your continued commitment to your health goals. Please review the care plan we discussed, and feel free to reach out if I can assist you further.  Medicare recommends these wellness visits once per year to help you and your care team stay ahead of potential health issues. These visits are designed to focus on prevention, allowing your provider to concentrate on managing your acute and chronic conditions during your regular appointments.  Please note that Annual Wellness Visits do not include a physical exam. Some assessments may be limited, especially if the visit was conducted virtually. If needed, we may recommend a separate in-person follow-up with your provider.  Ongoing Care Seeing your primary care provider every 3 to 6 months helps us  monitor your health and provide consistent, personalized care.   Referrals If a referral was made during today's visit and you haven't received any updates within two weeks, please contact the referred provider directly to check on the status.  Recommended Screenings:  Health Maintenance  Topic Date Due   DTaP/Tdap/Td vaccine (1 - Tdap) Never done   Pneumococcal Vaccine for age over 64 (1 of 2 - PCV) Never done   Zoster (Shingles) Vaccine (1 of 2) Never done   COVID-19 Vaccine (5 - 2025-26 season) 10/21/2023   Flu Shot  05/19/2024*   Medicare Annual Wellness Visit  12/11/2024   DEXA scan (bone density measurement)  Completed   Meningitis B Vaccine  Aged Out  *Topic was postponed. The date shown is not the original due date.       12/12/2023   10:13 AM  Advanced Directives  Does Patient Have a Medical Advance Directive? No  Would patient like information on creating a medical advance directive? Yes (MAU/Ambulatory/Procedural Areas - Information given)   Advance Care Planning is important because it: Ensures you receive medical care that aligns with your  values, goals, and preferences. Provides guidance to your family and loved ones, reducing the emotional burden of decision-making during critical moments.  Information on Advanced Care Planning can be found at [place]  Secretary of [facility] Advance Health Care Directives Advance Health Care Directives ([email or web address]   Vision: Annual vision screenings are recommended for early detection of glaucoma, cataracts, and diabetic retinopathy. These exams can also reveal signs of chronic conditions such as diabetes and high blood pressure.  Dental: Annual dental screenings help detect early signs of oral cancer, gum disease, and other conditions linked to overall health, including heart disease and diabetes.  Please see the attached documents for additional preventive care recommendations.

## 2023-12-18 DIAGNOSIS — D485 Neoplasm of uncertain behavior of skin: Secondary | ICD-10-CM | POA: Diagnosis not present

## 2023-12-18 DIAGNOSIS — D225 Melanocytic nevi of trunk: Secondary | ICD-10-CM | POA: Diagnosis not present

## 2023-12-18 DIAGNOSIS — L814 Other melanin hyperpigmentation: Secondary | ICD-10-CM | POA: Diagnosis not present

## 2023-12-18 DIAGNOSIS — D2239 Melanocytic nevi of other parts of face: Secondary | ICD-10-CM | POA: Diagnosis not present

## 2023-12-18 DIAGNOSIS — L821 Other seborrheic keratosis: Secondary | ICD-10-CM | POA: Diagnosis not present

## 2023-12-26 ENCOUNTER — Ambulatory Visit: Admitting: Family Medicine

## 2023-12-26 ENCOUNTER — Encounter: Payer: Self-pay | Admitting: Family Medicine

## 2023-12-26 VITALS — BP 124/74 | HR 60 | Temp 98.0°F | Ht 64.0 in | Wt 93.0 lb

## 2023-12-26 DIAGNOSIS — E782 Mixed hyperlipidemia: Secondary | ICD-10-CM

## 2023-12-26 DIAGNOSIS — K429 Umbilical hernia without obstruction or gangrene: Secondary | ICD-10-CM | POA: Diagnosis not present

## 2023-12-26 DIAGNOSIS — I10 Essential (primary) hypertension: Secondary | ICD-10-CM

## 2023-12-26 DIAGNOSIS — Z23 Encounter for immunization: Secondary | ICD-10-CM | POA: Diagnosis not present

## 2023-12-26 LAB — POCT LIPID PANEL
HDL: 67
LDL: 81
Non-HDL: 103
TC: 170
TRG: 108

## 2023-12-26 NOTE — Progress Notes (Unsigned)
 Subjective:  Patient ID: Dawn Harris, female    DOB: [birth year]  Age: 83 y.o. MRN: [id number]  Chief Complaint  Patient presents with   Medical Management of Chronic Issues    HPI: Discussed the use of AI scribe software for clinical note transcription with the patient, who gave verbal consent to proceed.  History of Present Illness Dawn Harris is an 83 year old female with hypertension and hyperlipidemia who presents for medication management and follow-up.  Blood pressure and heart rate management - Monitors blood pressure at home with readings ranging from 108/69 to 142/80 mmHg, most commonly in the 120s to 130s over 70s - Pulse ranges from 49 to 64 bpm, typically lower in the morning - Occasionally skips bisoprolol  hydrochlorothiazide  dose if blood pressure is low; blood pressure tends to be higher if a dose is missed - Currently taking bisoprolol  hydrochlorothiazide  2.5/6.25 mg, half a tablet in the morning and half in the evening  Hyperlipidemia and statin tolerance - Takes rosuvastatin  5 mg daily for cholesterol management, but only a couple of times per week due to concerns about timing with calcium  supplement - Previously experienced muscle aches with atorvastatin , but not with rosuvastatin  - Recent cholesterol labs: LDL 81 mg/dL, total cholesterol 829 mg/dL  Headache and blood pressure correlation - Occasional headaches upon waking, resolving after breakfast - Headaches associated with slightly elevated blood pressure  Abdominal wall hernia - Umbilical hernia causes discomfort when standing or after physical activity such as gardening or leaf blowing - No significant pain associated with the hernia  Bowel and bladder function - Manages bowel regularity with Clearlax - No bladder issues  General constitutional and respiratory symptoms - No fevers, chills, sweats, earaches, sore throat, stuffy nose, chest pain, or shortness of breath  Physical activity -  Remains active, engaging in gardening and yard work       12/12/2023   10:12 AM 09/16/2023   10:17 AM 03/18/2023    1:41 PM 12/10/2022    8:02 AM 06/23/2021    8:56 AM  Depression screen PHQ 2/9  Decreased Interest 0 0 0 0 0  Down, Depressed, Hopeless 0 0 0 0 0  PHQ - 2 Score 0 0 0 0 0  Altered sleeping  0     Tired, decreased energy  0     Change in appetite  0     Feeling bad or failure about yourself   0     Trouble concentrating  0     Moving slowly or fidgety/restless  0     Suicidal thoughts  0     PHQ-9 Score  0      Difficult doing work/chores  Not difficult at all        Data saved with a previous flowsheet row definition        12/11/2023    4:15 PM  Fall Risk   Falls in the past year? 1  Number falls in past yr: 0  Injury with Fall? 0  Risk for fall due to : History of fall(s)  Follow up Falls evaluation completed;Education provided;Falls prevention discussed    Patient Care Team: Sherre Clapper, MD as PCP - General (Family Medicine) Erasmo Bernardino BRAVO, OD (Optometry) Revankar, Jennifer SAUNDERS, MD as Consulting Physician (Cardiology) McCaughan, [place], DPM as Consulting Physician (Podiatry)   Review of Systems  Constitutional:  Negative for chills, fatigue and fever.  HENT:  Negative for congestion, ear pain and sore throat.   Respiratory:  Negative for cough and shortness of breath.   Cardiovascular:  Negative for chest pain.  Gastrointestinal:  Negative for abdominal pain, constipation, diarrhea, nausea and vomiting.  Genitourinary:  Negative for dysuria and urgency.  Musculoskeletal:  Negative for arthralgias and myalgias.  Skin:  Negative for rash.  Neurological:  Positive for headaches. Negative for dizziness.  Psychiatric/Behavioral:  Negative for dysphoric mood. The patient is not nervous/anxious.     Current Outpatient Medications on File Prior to Visit  Medication Sig Dispense Refill   bisoprolol -hydrochlorothiazide  (ZIAC ) 2.5-6.25 MG tablet Take 0.5 tablets  by mouth 2 (two) times daily. 100 tablet 1   calcium -vitamin D  (OSCAL WITH D) 500-200 MG-UNIT tablet Take 1 tablet by mouth daily.     Multiple Vitamins-Minerals (MULTIVITAMIN WITH MINERALS) tablet Take 1 tablet by mouth daily.     rosuvastatin  (CRESTOR ) 5 MG tablet Take 1 tablet (5 mg total) by mouth daily. 90 tablet 3   No current facility-administered medications on file prior to visit.   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   Carotid artery occlusion    Cataract    Chest pain on exertion 04/15/2023   Diverticulosis 10/19/2019   Diverticulosis 10/19/2019   Dizziness 09/21/2020   Dysuria 01/20/2023   Encounter for Medicare annual wellness exam 12/10/2022   Encounter for osteoporosis screening in asymptomatic postmenopausal patient 12/10/2022   Essential hypertension 10/19/2019   GERD (gastroesophageal reflux disease) 10/19/2019   Internal hemorrhoid 10/19/2019   Lumbar back pain 01/20/2023   Mild mitral regurgitation by prior echocardiogram 01/20/2023   Mixed hyperlipidemia 12/10/2022   Murmur 12/10/2022   Serum potassium elevated 04/15/2023   Past Surgical History:  Procedure Laterality Date   CATARACT EXTRACTION     EYE SURGERY      Family History  Problem Relation Age of Onset   Hypertension Mother    Arthritis Mother    Heart disease Father    Cancer Father    Hearing loss Father    Diabetes Neg Hx    Social History   Socioeconomic History   Marital status: Married    Spouse name: Not on file   Number of children: 2   Years of education: Not on file   Highest education level: 12th grade  Occupational History   Occupation: Retired  Tobacco Use   Smoking status: Never   Smokeless tobacco: Never  Vaping Use   Vaping status: Never Used  Substance and Sexual Activity   Alcohol use: Never   Drug use: Never   Sexual activity: Not Currently    Partners: Male  Other Topics Concern   Not on file  Social History  Narrative   Not on file   Social Drivers of Harris   Financial Resource Strain: Low Risk  (12/11/2023)   Overall Financial Resource Strain (CARDIA)    Difficulty of Paying Living Expenses: Not hard at all  Food Insecurity: No Food Insecurity (12/11/2023)   Hunger Vital Sign    Worried About Running Out of Food in the Last Year: Never true    Ran Out of Food in the Last Year: Never true  Transportation Needs: No Transportation Needs (12/11/2023)   PRAPARE - Administrator, Civil Service (Medical): No    Lack of Transportation (Non-Medical): No  Physical Activity: Sufficiently Active (12/11/2023)   Exercise Vital Sign    Days of Exercise per Week: 5 days    Minutes of Exercise per Session: 30 min  Stress: No Stress Concern Present (12/11/2023)   Dawn Harris - Occupational Stress Questionnaire    Feeling of Stress: Only a little  Social Connections: Socially Integrated (12/11/2023)   Social Connection and Isolation Panel    Frequency of Communication with Friends and Family: More than three times a week    Frequency of Social Gatherings with Friends and Family: Once a week    Attends Religious Services: More than 4 times per year    Active Member of [employer] or Organizations: Yes    Attends Engineer, Structural: More than 4 times per year    Marital Status: Married    Objective:  BP 124/74   Pulse 60   Temp 98 F (36.7 C)   Ht 5' 4 (1.626 m)   Wt 93 lb (42.2 kg)   LMP  (LMP Unknown)   SpO2 98%   BMI 15.96 kg/m      12/26/2023    8:31 AM 12/12/2023   10:11 AM 09/16/2023   10:12 AM  BP/Weight  Systolic BP 124 -- 134  Diastolic BP 74 -- 74  Wt. (Lbs) 93 92 92.2  BMI 15.96 kg/m2 15.79 kg/m2 15.83 kg/m2    Physical Exam Vitals reviewed.  Constitutional:      Appearance: Normal appearance.  Neck:     Vascular: No carotid bruit.  Cardiovascular:     Rate and Rhythm: Normal rate and regular rhythm.     Heart sounds:  Normal heart sounds.  Pulmonary:     Effort: Pulmonary effort is normal. No respiratory distress.     Breath sounds: Normal breath sounds.  Abdominal:     General: Abdomen is flat. Bowel sounds are normal.     Palpations: Abdomen is soft.     Tenderness: There is no abdominal tenderness.  Neurological:     Mental Status: She is alert and oriented to person, place, and time.  Psychiatric:        Mood and Affect: Mood normal.        Behavior: Behavior normal.     {Perform Simple Foot Exam  Perform Detailed exam:1} {Insert foot Exam (Optional):30965}   Lab Results  Component Value Date   WBC 5.7 09/16/2023   HGB 13.0 09/16/2023   HCT 38.3 09/16/2023   PLT 291 09/16/2023   GLUCOSE 77 12/26/2023   CHOL 205 (H) 09/16/2023   TRIG 103 09/16/2023   HDL 70 09/16/2023   LDLCALC 117 (H) 09/16/2023   ALT 15 12/26/2023   AST 24 12/26/2023   NA 134 12/26/2023   K 4.9 12/26/2023   CL 97 12/26/2023   CREATININE 0.91 12/26/2023   BUN 13 12/26/2023   CO2 22 12/26/2023   TSH 4.000 09/16/2023    Results for orders placed or performed in visit on 12/26/23  POCT Lipid Panel   Collection Time: 12/26/23  8:49 AM  Result Value Ref Range   TC 170    HDL 67    TRG 108    LDL 81    Non-HDL 103    TC/HDL    Comprehensive metabolic panel with GFR   Collection Time: 12/26/23  8:58 AM  Result Value Ref Range   Glucose 77 70 - 99 mg/dL   BUN 13 8 - 27 mg/dL   Creatinine, Ser 9.08 0.57 - 1.00 mg/dL   eGFR 63 >40 fO/fpw/8.26   BUN/Creatinine Ratio 14 12 - 28   Sodium 134 134 - 144 mmol/L   Potassium  4.9 3.5 - 5.2 mmol/L   Chloride 97 96 - 106 mmol/L   CO2 22 20 - 29 mmol/L   Calcium  9.5 8.7 - 10.3 mg/dL   Total Protein 7.1 6.0 - 8.5 g/dL   Albumin 4.6 3.7 - 4.7 g/dL   Globulin, Total 2.5 1.5 - 4.5 g/dL   Bilirubin Total 0.4 0.0 - 1.2 mg/dL   Alkaline Phosphatase 73 48 - 129 IU/L   AST 24 0 - 40 IU/L   ALT 15 0 - 32 IU/L  .  Assessment & Plan:   Assessment & Plan Essential  hypertension Blood pressure controlled with bisoprolol  hydrochlorothiazide . Occasional morning headaches resolve after breakfast. - Continue bisoprolol  hydrochlorothiazide  2.5/6.25 mg, half a tablet twice daily. Orders:   Comprehensive metabolic panel with GFR  Mixed hyperlipidemia Cholesterol levels improved with rosuvastatin  and is at goal. LDL decreased to 81. No muscle aches reported. - Encouraged regular intake of rosuvastatin  with blood pressure medication.  Orders:   POCT Lipid Panel   Comprehensive metabolic panel with GFR  Umbilical hernia without obstruction and without gangrene Umbilical hernia with occasional discomfort during physical activity. No significant pain reported.    Encounter for immunization  Orders:   Flu vaccine HIGH DOSE PF(Fluzone Trivalent)  Encounter for immunization     Body mass index is 15.96 kg/m.   No orders of the defined types were placed in this encounter.   Orders Placed This Encounter  Procedures   Flu vaccine HIGH DOSE PF(Fluzone Trivalent)   Comprehensive metabolic panel with GFR   POCT Lipid Panel     I,Marla I Leal-Borjas,acting as a scribe for Abigail Free, MD.,have documented all relevant documentation on the behalf of Abigail Free, MD,as directed by  Abigail Free, MD while in the presence of Abigail Free, MD.   Follow-up: Return in about 6 months (around 06/24/2024) for chronic follow up.  An After Visit Summary was printed and given to the patient. I attest that I have reviewed this visit and agree with the plan scribed by my staff.   Abigail Free, MD [facility] [phone]

## 2023-12-26 NOTE — Assessment & Plan Note (Addendum)
 Blood pressure controlled with bisoprolol  hydrochlorothiazide . Occasional morning headaches resolve after breakfast. - Continue bisoprolol  hydrochlorothiazide  2.5/6.25 mg, half a tablet twice daily. Orders:   Comprehensive metabolic panel with GFR

## 2023-12-26 NOTE — Patient Instructions (Signed)
  VISIT SUMMARY: Today, we reviewed your blood pressure and cholesterol management, discussed your occasional headaches, and addressed your umbilical hernia discomfort. Your current medications and physical activity levels are helping to manage your conditions effectively.  YOUR PLAN: ESSENTIAL HYPERTENSION: Your blood pressure is well-controlled with your current medication, although you occasionally experience morning headaches that resolve after breakfast. -Continue taking bisoprolol  hydrochlorothiazide  2.5/6.25 mg, half a tablet twice daily.  MIXED HYPERLIPIDEMIA: Your cholesterol levels have improved with rosuvastatin , and you have not experienced muscle aches. -Take rosuvastatin  regularly with your blood pressure medication.  UMBILICAL HERNIA: You have an umbilical hernia that causes occasional discomfort during physical activity, but no significant pain. -Monitor the hernia for any changes in pain or discomfort.  GENERAL HEALTH MAINTENANCE: Your physical activity is contributing to improved cholesterol levels. -Continue your current level of physical activity.                      Contains text generated by Abridge.                                 Contains text generated by Abridge.

## 2023-12-26 NOTE — Assessment & Plan Note (Addendum)
 Cholesterol levels improved with rosuvastatin  and is at goal. LDL decreased to 81. No muscle aches reported. - Encouraged regular intake of rosuvastatin  with blood pressure medication.  Orders:   POCT Lipid Panel   Comprehensive metabolic panel with GFR

## 2023-12-27 LAB — COMPREHENSIVE METABOLIC PANEL WITH GFR
ALT: 15 IU/L (ref 0–32)
AST: 24 IU/L (ref 0–40)
Albumin: 4.6 g/dL (ref 3.7–4.7)
Alkaline Phosphatase: 73 IU/L (ref 48–129)
BUN/Creatinine Ratio: 14 (ref 12–28)
BUN: 13 mg/dL (ref 8–27)
Bilirubin Total: 0.4 mg/dL (ref 0.0–1.2)
CO2: 22 mmol/L (ref 20–29)
Calcium: 9.5 mg/dL (ref 8.7–10.3)
Chloride: 97 mmol/L (ref 96–106)
Creatinine, Ser: 0.91 mg/dL (ref 0.57–1.00)
Globulin, Total: 2.5 g/dL (ref 1.5–4.5)
Glucose: 77 mg/dL (ref 70–99)
Potassium: 4.9 mmol/L (ref 3.5–5.2)
Sodium: 134 mmol/L (ref 134–144)
Total Protein: 7.1 g/dL (ref 6.0–8.5)
eGFR: 63 mL/min/1.73 (ref >59)

## 2023-12-28 ENCOUNTER — Ambulatory Visit: Payer: Self-pay | Admitting: Family Medicine

## 2024-01-09 ENCOUNTER — Ambulatory Visit (INDEPENDENT_AMBULATORY_CARE_PROVIDER_SITE_OTHER): Admitting: Podiatry

## 2024-01-09 DIAGNOSIS — B351 Tinea unguium: Secondary | ICD-10-CM

## 2024-01-09 DIAGNOSIS — M79675 Pain in left toe(s): Secondary | ICD-10-CM

## 2024-01-09 DIAGNOSIS — M79674 Pain in right toe(s): Secondary | ICD-10-CM | POA: Diagnosis not present

## 2024-01-09 NOTE — Progress Notes (Signed)
    Subjective:  Patient ID: [name], female    DOB: 1940-04-23,  MRN: [id number]  Nastashia Gallo presents to clinic today for:  Chief Complaint  Patient presents with   [facility]    Not diabetic. No anticoag. Problematic nail is the right 2nd. Thickened and grows upwards. Left 3rd has a spot where a mole was removed by dermatology a week ago.    Patient notes nails are thick, discolored, elongated and painful in shoegear when trying to ambulate.    PCP is Cox, Abigail, MD.  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   Carotid artery occlusion    Cataract    Chest pain on exertion 04/15/2023   Diverticulosis 10/19/2019   Diverticulosis 10/19/2019   Dizziness 09/21/2020   Dysuria 01/20/2023   Encounter for Medicare annual wellness exam 12/10/2022   Encounter for osteoporosis screening in asymptomatic postmenopausal patient 12/10/2022   Essential hypertension 10/19/2019   GERD (gastroesophageal reflux disease) 10/19/2019   Internal hemorrhoid 10/19/2019   Lumbar back pain 01/20/2023   Mild mitral regurgitation by prior echocardiogram 01/20/2023   Mixed hyperlipidemia 12/10/2022   Murmur 12/10/2022   Serum potassium elevated 04/15/2023   Past Surgical History:  Procedure Laterality Date   CATARACT EXTRACTION     EYE SURGERY     Allergies  Allergen Reactions   Penicillins Hives   Lipitor [Atorvastatin ]     Muscle pain    Review of Systems: Negative except as noted in the HPI.  Objective:  Carliyah Cotterman is a pleasant 83 y.o. female in NAD. AAO x 3.  Vascular Examination: Capillary refill time is 3-5 seconds to toes bilateral. Palpable pedal pulses b/l LE. Digital hair present b/l.  Skin temperature gradient WNL b/l. No varicosities b/l. No cyanosis noted b/l.   Dermatological Examination: Pedal skin with normal turgor, texture and tone b/l. No open wounds. No interdigital macerations b/l. Toenails x10 are 3mm thick, discolored,  dystrophic with subungual debris. There is pain with compression of the nail plates.  They are elongated x10  Assessment/Plan: 1. Pain due to onychomycosis of toenails of both feet    The mycotic toenails were sharply debrided x10 with sterile nail nippers and a power debriding burr to decrease bulk/thickness and length.    Return if symptoms worsen or fail to improve, for once per year for toenails per her preference.   Awanda CHARM Imperial, DPM, FACFAS [facility]     [address] [place], [place] [address]                Office [phone]  Fax [phone]

## 2024-01-13 DIAGNOSIS — I6529 Occlusion and stenosis of unspecified carotid artery: Secondary | ICD-10-CM | POA: Insufficient documentation

## 2024-01-13 DIAGNOSIS — H269 Unspecified cataract: Secondary | ICD-10-CM | POA: Insufficient documentation

## 2024-01-14 ENCOUNTER — Ambulatory Visit: Attending: Cardiology | Admitting: Cardiology

## 2024-01-14 ENCOUNTER — Encounter: Payer: Self-pay | Admitting: Cardiology

## 2024-01-14 VITALS — BP 118/64 | HR 64 | Ht 64.0 in | Wt 93.2 lb

## 2024-01-14 DIAGNOSIS — I700 Atherosclerosis of aorta: Secondary | ICD-10-CM | POA: Diagnosis not present

## 2024-01-14 DIAGNOSIS — I10 Essential (primary) hypertension: Secondary | ICD-10-CM | POA: Diagnosis not present

## 2024-01-14 DIAGNOSIS — E782 Mixed hyperlipidemia: Secondary | ICD-10-CM | POA: Diagnosis not present

## 2024-01-14 NOTE — Patient Instructions (Signed)

## 2024-01-14 NOTE — Progress Notes (Signed)
 Cardiology Office Note:    Date:  01/14/2024   ID:  Dawn Harris, DOB 1940-08-15, MRN [id number]  PCP:  Sherre Clapper, MD  Cardiologist:  Jennifer JONELLE Crape, MD   Referring MD: Sherre Clapper, MD    ASSESSMENT:    1. Aortic atherosclerosis   2. Essential hypertension   3. Mixed hyperlipidemia    PLAN:    In order of problems listed above:  Primary prevention stressed with the patient.  Importance of compliance with diet medication stressed and patient verbalized standing. She was advised to ambulate to the best of her ability. Aortic atherosclerosis: Stable we will continue to monitor. Mitral regurgitation: Stable and discussed with patient.  She had question about murmur.  Medical management. Mixed dyslipidemia: On lipid-lowering medications followed by primary care.  Lipids reviewed.  Diet emphasized. Essential hypertension: Blood pressure stable and diet was emphasized.  Lifestyle modification urged.  Salt and dietary issues discussed at length. Patient will be seen in follow-up appointment in 6 months or earlier if the patient has any concerns.    Medication Adjustments/Labs and Tests Ordered: Current medicines are reviewed at length with the patient today.  Concerns regarding medicines are outlined above.  No orders of the defined types were placed in this encounter.  No orders of the defined types were placed in this encounter.    No chief complaint on file.    History of Present Illness:    Dawn Harris is a 83 y.o. female.  Patient has past medical history of aortic atherosclerosis, carotid artery disease, mitral regurgitation and mixed dyslipidemia.  She denies any problems at this time and takes care of her activities of daily living.  No chest pain orthopnea PND.  At the time of my evaluation, the patient is alert awake oriented and in no distress.  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   Aortic atherosclerosis  04/17/2023   Carotid artery occlusion    Cataract    Encounter for Medicare annual wellness exam 12/10/2022   Encounter for osteoporosis screening in asymptomatic postmenopausal patient 12/10/2022   Essential hypertension 10/19/2019   GERD (gastroesophageal reflux disease) 10/19/2019   Internal hemorrhoid 10/19/2019   Lumbar back pain 01/20/2023   Mild mitral regurgitation by prior echocardiogram 01/20/2023   Mixed hyperlipidemia 12/10/2022    Past Surgical History:  Procedure Laterality Date   CATARACT EXTRACTION     EYE SURGERY      Current Medications: Current Meds  Medication Sig   bisoprolol -hydrochlorothiazide  (ZIAC ) 2.5-6.25 MG tablet Take 0.5 tablets by mouth 2 (two) times daily.   calcium -vitamin D  (OSCAL WITH D) 500-200 MG-UNIT tablet Take 1 tablet by mouth daily.   Multiple Vitamins-Minerals (MULTIVITAMIN WITH MINERALS) tablet Take 1 tablet by mouth daily.   rosuvastatin  (CRESTOR ) 5 MG tablet Take 1 tablet (5 mg total) by mouth daily.     Allergies:   Penicillins and Lipitor [atorvastatin ]   Social History   Socioeconomic History   Marital status: Married    Spouse name: Not on file   Number of children: 2   Years of education: Not on file   Highest education level: 12th grade  Occupational History   Occupation: Retired  Tobacco Use   Smoking status: Never   Smokeless tobacco: Never  Vaping Use   Vaping status: Never Used  Substance and Sexual Activity   Alcohol use: Never   Drug use: Never   Sexual activity: Not Currently    Partners: Male  Other Topics Concern   Not on file  Social History Narrative   Not on file   Social Drivers of Health   Financial Resource Strain: Low Risk  (12/11/2023)   Overall Financial Resource Strain (CARDIA)    Difficulty of Paying Living Expenses: Not hard at all  Food Insecurity: No Food Insecurity (12/11/2023)   Hunger Vital Sign    Worried About Running Out of Food in the Last Year: Never true    Ran Out of  Food in the Last Year: Never true  Transportation Needs: No Transportation Needs (12/11/2023)   PRAPARE - Administrator, Civil Service (Medical): No    Lack of Transportation (Non-Medical): No  Physical Activity: Sufficiently Active (12/11/2023)   Exercise Vital Sign    Days of Exercise per Week: 5 days    Minutes of Exercise per Session: 30 min  Stress: No Stress Concern Present (12/11/2023)   [employer] of Occupational Health - Occupational Stress Questionnaire    Feeling of Stress: Only a little  Social Connections: Socially Integrated (12/11/2023)   Social Connection and Isolation Panel    Frequency of Communication with Friends and Family: More than three times a week    Frequency of Social Gatherings with Friends and Family: Once a week    Attends Religious Services: More than 4 times per year    Active Member of [employer] or Organizations: Yes    Attends Engineer, Structural: More than 4 times per year    Marital Status: Married     Family History: The patient's family history includes Arthritis in her mother; Cancer in her father; Hearing loss in her father; Heart disease in her father; Hypertension in her mother. There is no history of Diabetes.  ROS:   Please see the history of present illness.    All other systems reviewed and are negative.  EKGs/Labs/Other Studies Reviewed:    The following studies were reviewed today: .SABRA   I discussed my findings with the patient at length   Recent Labs: 04/15/2023: NT-Pro BNP 379 09/16/2023: Hemoglobin 13.0; Platelets 291; TSH 4.000 12/26/2023: ALT 15; BUN 13; Creatinine, Ser 0.91; Potassium 4.9; Sodium 134  Recent Lipid Panel    Component Value Date/Time   CHOL 205 (H) 09/16/2023 1115   TRIG 103 09/16/2023 1115   HDL 70 09/16/2023 1115   CHOLHDL 2.9 09/16/2023 1115   LDLCALC 117 (H) 09/16/2023 1115    Physical Exam:    VS:  BP 118/64   Pulse 64   Ht 5' 4 (1.626 m)   Wt 93 lb 3.2 oz (42.3 kg)    LMP  (LMP Unknown)   SpO2 98%   BMI 16.00 kg/m     Wt Readings from Last 3 Encounters:  01/14/24 93 lb 3.2 oz (42.3 kg)  12/26/23 93 lb (42.2 kg)  12/12/23 92 lb (41.7 kg)     GEN: Patient is in no acute distress HEENT: Normal NECK: No JVD; No carotid bruits LYMPHATICS: No lymphadenopathy CARDIAC: Hear sounds regular, 2/6 systolic murmur at the apex. RESPIRATORY:  Clear to auscultation without rales, wheezing or rhonchi  ABDOMEN: Soft, non-tender, non-distended MUSCULOSKELETAL:  No edema; No deformity  SKIN: Warm and dry NEUROLOGIC:  Alert and oriented x 3 PSYCHIATRIC:  Normal affect   Signed, Jennifer JONELLE Crape, MD  01/14/2024 4:00 PM    [facility]

## 2024-06-24 ENCOUNTER — Ambulatory Visit: Admitting: Family Medicine

## 2024-12-17 ENCOUNTER — Ambulatory Visit
